# Patient Record
Sex: Male | Born: 1960 | Race: Black or African American | Hispanic: No | Smoking: Current every day smoker
Health system: Southern US, Community
[De-identification: ages and names within clinical notes are randomized; demographics above are authoritative.]

## PROBLEM LIST (undated history)

## (undated) DIAGNOSIS — N135 Crossing vessel and stricture of ureter without hydronephrosis: Secondary | ICD-10-CM

## (undated) DIAGNOSIS — IMO0001 Reserved for inherently not codable concepts without codable children: Secondary | ICD-10-CM

## (undated) DIAGNOSIS — K219 Gastro-esophageal reflux disease without esophagitis: Secondary | ICD-10-CM

## (undated) HISTORY — PX: ANKLE SURGERY: SHX546

---

## 2002-01-20 ENCOUNTER — Emergency Department (HOSPITAL_COMMUNITY): Admission: EM | Admit: 2002-01-20 | Discharge: 2002-01-20 | Payer: Self-pay

## 2003-09-13 ENCOUNTER — Emergency Department (HOSPITAL_COMMUNITY): Admission: EM | Admit: 2003-09-13 | Discharge: 2003-09-13 | Payer: Self-pay | Admitting: Family Medicine

## 2003-09-17 ENCOUNTER — Encounter: Admission: RE | Admit: 2003-09-17 | Discharge: 2003-09-17 | Payer: Self-pay | Admitting: Orthopedic Surgery

## 2003-09-19 ENCOUNTER — Emergency Department (HOSPITAL_COMMUNITY): Admission: EM | Admit: 2003-09-19 | Discharge: 2003-09-19 | Payer: Self-pay | Admitting: Internal Medicine

## 2003-09-30 ENCOUNTER — Ambulatory Visit (HOSPITAL_BASED_OUTPATIENT_CLINIC_OR_DEPARTMENT_OTHER): Admission: RE | Admit: 2003-09-30 | Discharge: 2003-09-30 | Payer: Self-pay | Admitting: Orthopedic Surgery

## 2003-11-30 ENCOUNTER — Encounter: Admission: RE | Admit: 2003-11-30 | Discharge: 2004-02-28 | Payer: Self-pay | Admitting: Orthopedic Surgery

## 2004-05-10 ENCOUNTER — Emergency Department (HOSPITAL_COMMUNITY): Admission: EM | Admit: 2004-05-10 | Discharge: 2004-05-10 | Payer: Self-pay | Admitting: Emergency Medicine

## 2005-11-22 ENCOUNTER — Emergency Department (HOSPITAL_COMMUNITY): Admission: EM | Admit: 2005-11-22 | Discharge: 2005-11-22 | Payer: Self-pay | Admitting: Emergency Medicine

## 2005-12-07 ENCOUNTER — Emergency Department (HOSPITAL_COMMUNITY): Admission: EM | Admit: 2005-12-07 | Discharge: 2005-12-07 | Payer: Self-pay | Admitting: Emergency Medicine

## 2006-04-11 ENCOUNTER — Ambulatory Visit (HOSPITAL_BASED_OUTPATIENT_CLINIC_OR_DEPARTMENT_OTHER): Admission: RE | Admit: 2006-04-11 | Discharge: 2006-04-12 | Payer: Self-pay | Admitting: Orthopedic Surgery

## 2006-04-12 ENCOUNTER — Ambulatory Visit (HOSPITAL_COMMUNITY): Admission: RE | Admit: 2006-04-12 | Discharge: 2006-04-12 | Payer: Self-pay | Admitting: Orthopedic Surgery

## 2006-09-06 ENCOUNTER — Emergency Department (HOSPITAL_COMMUNITY): Admission: EM | Admit: 2006-09-06 | Discharge: 2006-09-06 | Payer: Self-pay | Admitting: Emergency Medicine

## 2007-02-26 ENCOUNTER — Emergency Department (HOSPITAL_COMMUNITY): Admission: EM | Admit: 2007-02-26 | Discharge: 2007-02-26 | Payer: Self-pay | Admitting: Emergency Medicine

## 2007-02-28 HISTORY — PX: LEG AMPUTATION BELOW KNEE: SHX694

## 2007-05-20 ENCOUNTER — Inpatient Hospital Stay (HOSPITAL_COMMUNITY): Admission: EM | Admit: 2007-05-20 | Discharge: 2007-05-20 | Payer: Self-pay | Admitting: Emergency Medicine

## 2007-05-22 ENCOUNTER — Inpatient Hospital Stay (HOSPITAL_COMMUNITY): Admission: EM | Admit: 2007-05-22 | Discharge: 2007-05-27 | Payer: Self-pay | Admitting: Family Medicine

## 2007-05-23 ENCOUNTER — Ambulatory Visit: Payer: Self-pay | Admitting: Infectious Diseases

## 2007-05-31 ENCOUNTER — Inpatient Hospital Stay (HOSPITAL_COMMUNITY): Admission: RE | Admit: 2007-05-31 | Discharge: 2007-06-03 | Payer: Self-pay | Admitting: Orthopedic Surgery

## 2007-05-31 ENCOUNTER — Encounter (INDEPENDENT_AMBULATORY_CARE_PROVIDER_SITE_OTHER): Payer: Self-pay | Admitting: Orthopedic Surgery

## 2007-07-03 ENCOUNTER — Ambulatory Visit: Payer: Self-pay | Admitting: Internal Medicine

## 2007-07-03 DIAGNOSIS — S88119A Complete traumatic amputation at level between knee and ankle, unspecified lower leg, initial encounter: Secondary | ICD-10-CM | POA: Insufficient documentation

## 2007-07-03 DIAGNOSIS — B957 Other staphylococcus as the cause of diseases classified elsewhere: Secondary | ICD-10-CM | POA: Insufficient documentation

## 2007-07-03 DIAGNOSIS — M86679 Other chronic osteomyelitis, unspecified ankle and foot: Secondary | ICD-10-CM | POA: Insufficient documentation

## 2007-10-28 ENCOUNTER — Encounter: Admission: RE | Admit: 2007-10-28 | Discharge: 2007-12-17 | Payer: Self-pay | Admitting: Orthopedic Surgery

## 2007-11-14 ENCOUNTER — Emergency Department (HOSPITAL_COMMUNITY): Admission: EM | Admit: 2007-11-14 | Discharge: 2007-11-14 | Payer: Self-pay | Admitting: Family Medicine

## 2010-07-12 NOTE — Op Note (Signed)
Michael Morrison, DEMARTINI NO.:  [id number]   MEDICAL RECORD NO.:  [id number]          PATIENT TYPE:  INP   LOCATION:  5152                         FACILITY:  [facility]   PHYSICIAN:  Leonides Grills, M.D.     DATE OF BIRTH:  [birth year]   DATE OF PROCEDURE:  05/25/2007  DATE OF DISCHARGE:                               OPERATIVE REPORT   PREOPERATIVE DIAGNOSES:  1. Left medial ankle abscess.  2. Left posterior lateral ankle abscess.  3. Left ankle septic arthritis.   POSTOPERATIVE DIAGNOSES:  1. Left medial ankle abscess.  2. Left posterior lateral ankle abscess.  3. Left ankle septic arthritis.   OPERATION:  1. I and D to bone left medial ankle abscess.  2. I and D to bone left posterior lateral ankle abscess.  3. Left ankle arthroscopy with debridement.   ANESTHESIA:  General.   SURGEON:  Leonides Grills, M.D.   ASSISTANT:  Richardean Canal, P.A.   ESTIMATED BLOOD LOSS:  Approximately 200 mL.   COMPLICATIONS:  None.   CULTURES TAKEN:  Aerobic and anaerobic.   FINDINGS:  Gross purulence in all three areas.   DRAINS:  Two 1/8 inch drains, one placed medially and one placed  posterolaterally.   DISPOSITION:  Stable to PR.   INDICATION:  This is a 50 year old male who sustained a pilon fracture  well over 5 years ago.  This was complicated by post-traumatic ankle  arthritis.  He also developed a deep infection.  He required multiple I  and D's to include hardware removal and since the hardware removal has  not had any complications of drainage or an infection.  It was thought  that he had osteomyelitis of the distal tibia and likely of the talus.  He was doing well up until this past weekend when he was doing a job  where he was walking up and down ladders and he was not wearing his ASO  brace.  He was admitted to Dr. Albertina Parr service on Monday.  It was  thought that he had a flareup of his septic arthritis.  He had no sign  of infection at that time.  Over the  last week, however, he has had  increased swelling and pain and by MRI which showed that he had a pocket  of likely purulence and this was aspirated under C-arm guidance and he  was found to have purulence.  He was then consented to the above  procedure.  All risks which include infection, nerve/vessel injury,  persistent deep infection, likelihood that he has osteomyelitis and the  fact that this would best be treated with a below knee amputation and  possibility that this may spread systemically and have more proximal  amputation if the infection spread were all explained.  Questions were  encouraged and answered.   PROCEDURE IN DETAIL:  The patient was brought to the operating room and  placed in supine position after adequate general endotracheal anesthesia  was administered the patient was already on vancomycin IV piggyback.  The patient was then placed in the supine position and  the left lower  extremity was prepped and draped in a sterile manner.  All bony  prominences were well-padded.  We started the procedure by palpating a  fluctuant area medially about the left ankle.  A longitudinal incision  was then made.  There was gross purulence.  The incision was extended  approximately to about 4 cm.  This was on the anteromedial aspect of the  ankle.  Once this was completely debrided approximately 3 liters of  normal saline was passed through this area.  We then made a longitudinal  incision posterolaterally where there was a palpable area of fluctuance.  Again there was gross purulence.  Again another approximately 3 liters  of normal saline was irrigated through this area as well.  Once this was  fully irrigated the loculations were removed.  We then made a  longitudinal incision over the just lateral to the peroneus tertius  tendon into the ankle.  Again there was gross purulence in the ankle and  blunt tip trocar with cannula followed by camera was placed into the  ankle.  Then  we made a portal anteromedially with a nick and spread  technique and debrided the ankle of loculations with a shaver.  Once the  area was completely debrided again there was gross arthritis throughout  his ankle, no obvious loose bodies and we passed approximately 6 liters  of normal saline through his ankle as well.  Total amount of saline used  for this I and D was 12 liters.  At the end of the procedure there were  no other areas of fluctuation over the entire ankle or foot or leg.  There was no abnormal warmth anywhere as well.  We then placed one 1/8  inch drain in the medial wound and a 1/8 inch drain in the  posterolateral wound.  These were protected deep and then a loose  closure with nylon was then performed over all wounds.  We had a  communication of the ankle joint with both the medial and lateral wounds  and placed a drain over this area so that not only would it drain the  wound but also the ankle as well.  This was tunneled between the two  areas to the ankle.  A sterile dressing was applied, Jones dressing was  applied.  The patient was stable to the PR.  Also note that cultures  were obtained, aerobic and anaerobic, during the surgery as well.      Leonides Grills, M.D.  Electronically Signed     PB/MEDQ  D:  05/25/2007  T:  05/26/2007  Job:  [id number]

## 2010-07-12 NOTE — Consult Note (Signed)
Michael Morrison, Michael Morrison NO.:  [id number]   MEDICAL RECORD NO.:  [id number]          PATIENT TYPE:  INP   LOCATION:  1304                         FACILITY:  [facility]   PHYSICIAN:  Michael Morrison, M.D.     DATE OF BIRTH:  09/02/1960   DATE OF CONSULTATION:  05/20/2007  DATE OF DISCHARGE:                                 CONSULTATION   CHIEF COMPLAINT:  Left ankle pain.   HISTORY:  This is a 50 year old male who is well known to our service.  He had a previous severely comminuted left closed pilon fracture that  required open reduction/internal fixation.  This was complicated by a  deep infection that required I&D.  Eventually, once his fracture healed,  removed the hardware, and treated him with IV antibiotics.  Since this  time, he has not had any drainage.  He has been wearing an ASO brace and  taking anti-inflammatories.  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  He was not wearing his brace and flared  up his ankle.  He has had no fever, chills.  No drainage.  Just advanced  pain in the left ankle.   He has GERD.   He takes Lamisil.   He has no known drug allergies.   He smokes cigarettes, one pack per day.   REVIEW OF SYSTEMS:  He denies any fevers or chills.  No drainage.   PHYSICAL EXAMINATION:  Limited range of motion, left ankle.  No  erythema.  No fluctuans.  Tenderness on the anterior aspect.  He is  neurovascularly intact.   X-rays are evaluated.  Three views of the ankle obtained on 05/20/07.  Did show severely arthritic left ankle with cystic areas within the  distal tibia.   IMPRESSION:  Advanced post-traumatic left ankle arthritis.   PLAN:  I explained to Michael Morrison at this point, we will give him a dose  of Toradol today.  He is to go home on Mobic daily, 15 mg p.o. daily.  He is to follow up with [place] in two weeks.  He may benefit from a steroid  dose pack as  well and will likely do steroid dose pack followed  by Mobic.  Elevation  and ice are encouraged.  If he is to go about, he is to wear his ASO  brace.   I went over this with Dr. Concepcion Morrison, his admitting physician, and all  questions were encouraged and answered.      Michael Morrison, M.D.  Electronically Signed     PB/MEDQ  D:  05/20/2007  T:  05/20/2007  Job:  [id number]

## 2010-07-12 NOTE — Op Note (Signed)
NAMESTONY, STEGMANN NO.:  [id number]   MEDICAL RECORD NO.:  [id number]          PATIENT TYPE:  INP   LOCATION:  5152                         FACILITY:  [facility]   PHYSICIAN:  Leonides Grills, M.D.     DATE OF BIRTH:  [birth year]   DATE OF PROCEDURE:  05/25/2007  DATE OF DISCHARGE:  05/27/2007                               OPERATIVE REPORT   ADDENDUM:  In the actual procedure note, in the procedure in detail,  please add that both the medial ankle abscess as well as the posterior  lateral abscess, which were done through separate incisions, were  debridement down to bone in both instances.      Leonides Grills, M.D.  Electronically Signed     PB/MEDQ  D:  05/30/2007  T:  05/30/2007  Job:  [id number]

## 2010-07-12 NOTE — H&P (Addendum)
NAMENUH, LIPTON NO.:  [id number]   MEDICAL RECORD NO.:  [id number]          PATIENT TYPE:  EMS   LOCATION:  MAJO                         FACILITY:  [facility]   PHYSICIAN:  Marcellus Scott, MD     DATE OF BIRTH:  [birth year]   DATE OF ADMISSION:  05/22/2007  DATE OF DISCHARGE:                              HISTORY & PHYSICAL   PRIMARY MEDICAL DOCTOR:  Fleet Contras[place]  However, the patient claims he is going to be seeing a new  PMD shortly whose name is Alwyn Pea of [facility].  ORTHOPAEDICIANLeonides Grills, M.D.                            CHIEF COMPLAINT:  Painful swollen left ankle.   HISTORY OF PRESENTING ILLNESS:  Mr. Galant is a pleasant 50 year old  African-American male patient with a history of multiple surgeries on  the left ankle with baseline 2/10 pain.  He was in his usual state of  health until four to five days ago when after working on a ladder at his  home noticed painful swelling of his left ankle.  He then  presented to  [facility] where he was evaluated by orthopedics and  suggested to have posttraumatic left ankle arthritis.  The patient was  admitted, given a dose of vancomycin and subsequently discharged on  Vicodin and prednisone taper.  The patient however indicates that the  pain and swelling has progressively gotten worse which is 13/10!  The  patient, however, denies any direct trauma.  He denies any fever, chills  or rigors.  He took almost 12 tablets of Vicodin 7.5/750 between 3 p.m.  and 5 a.m. last night and has presented to the emergency room.  Blood  cultures drawn on the 23rd of March, one bottle shows gram-positive  cocci in clusters suggestive of Staph aureus.   PAST MEDICAL HISTORY:  None.   PAST SURGICAL HISTORY:  Left ankle fractures with three surgeries, the  last one on the 13th of February 2008 when hardware was removed.   PSYCHIATRIC HISTORY:   None.   ALLERGIES:  NO KNOWN DRUG ALLERGIES.   MEDICATIONS:  1. Vicodin 7.5/750 one p.o. q 4 hourly p.r.n.  2. Prednisone taper which is on day 3 which is 40 mg.   FAMILY HISTORY:  Strong family history of diabetes in uncle, brother,  mother, sister.   SOCIAL HISTORY:  The patient is married.  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  20 pack-year smoking history.  No  history of alcohol or drug abuse.  The patient is independent of  activities of daily living.   REVIEW OF SYSTEMS:  14 systems reviewed.  Apart from history of  presenting illness, his history is pertinent for occasional right-sided  abdominal pain which has been evaluated with CT of the abdomen  outpatient, report of which is pending.  The patient currently does not  have any abdominal  pain; also constipation.   PHYSICAL EXAMINATION:  Mr. Broyles is a moderately-built  and nourished  patient in no obvious distress.  VITAL SIGNS:  Temperature 100.6 degrees Fahrenheit, blood pressure  133/70 mmHg, pulse 86 per minute, regular, respirations 18 per minute,  saturating at 95% on room air.  HEAD, EYES, ENT:  Nontraumatic, normocephalic.  Pupils equally reacting  to light and accommodation.  Oral cavity with no oropharyngeal erythema  or thrush.  NECK:  Supple.  No JVD or carotid bruit.  LYMPHATICS:  No lymphadenopathy.  RESPIRATORY SYSTEM:  Clear to auscultation.  CARDIOVASCULAR SYSTEM:  First and second heart sounds heard.  No third  or fourth heart sounds or murmurs.  ABDOMEN:  Nondistended, nontender.  No organomegaly or mass appreciated.  Bowel sounds are normally heard.  CENTRAL NERVOUS SYSTEM:  The patient is awake, alert and oriented x3.  No focal neurological deficits.  EXTREMITIES:  Left ankle with surgical scars.  There are no open wounds.  Left ankle is swollen moderately with soft tissue swelling.  Warm and  tender with painful range of movements.  Peripheral pulses are   symmetrically felt.  SKIN:  Without any rashes.   LABORATORY DATA:  Comprehensive metabolic panel unremarkable except for  albumin of 3.2.  BUN is 7, creatinine is 0.79, blood acetaminophen level  less than 10.  CBC:  Hemoglobin 13.2, hematocrit 39.6, white blood cell  21.3, platelets 252.  Blood culture as indicated on the 23rd of March  was positive for gram-positive cocci in clusters, and the next sample  was no growth to date.  Serum uric acid on the 23rd of March was 4.1.  ESR on the 23rd of March was 85.   His ankle x-rays done today with no interval change.   ASSESSMENT/PLAN:  1. Painful swollen left ankle/rule out acute osteomyelitis versus      septic arthritis versus post traumatic left ankle arthritis.  Will      admit patient to medical floor.  Will repeat blood cultures.  Will      obtain MRI of the left ankle.  Will place the patient empirically      on IV vancomycin.  Will provide p.r.n. pain medications including      IV Dilaudid and p.o. oxycodone.  Will avoid Tylenol  at this time.      Orthopedics has been consulted.  I have discussed his case with Dr.      Darlina Sicilian of infectious disease who is agreeable to continue IV      vancomycin alone at this time.  If there is significant fluid, it      is  in the left ankle joint to be aspirated. The patient will be      seen tomorrow by infectious disease.  2. Leukocytosis.  Probably secondary to problem #1 and steroids.  Will      hold steroids at this time.  3. Tobacco abuse.  For counseling and nicotine patch.  4. Staphylococcal bacteremia.  For IV vancomycin pending cultures.      Marcellus Scott, MD  Electronically Signed     AH/MEDQ  D:  05/22/2007  T:  05/22/2007  Job:  [id number]   cc:   Fleet Contras, M.D.  Leonides Grills, M.D.

## 2010-07-12 NOTE — Discharge Summary (Signed)
Michael Morrison, Michael Morrison NO.:  [id number]   MEDICAL RECORD NO.:  [id number]          PATIENT TYPE:  INP   LOCATION:  5152                         FACILITY:  [facility]   PHYSICIAN:  Leonides Grills, M.D.     DATE OF BIRTH:  11-Mar-1960   DATE OF ADMISSION:  05/22/2007  DATE OF DISCHARGE:  05/27/2007                               DISCHARGE SUMMARY   ADMITTING DIAGNOSES:  1. Left ankle pain.  2. Leukocytosis.   DISCHARGE DIAGNOSES:  1. Left ankle abscess with osteomyelitis status post arthroscopic      incision and drainage.  2. Methicillin-resistant Staphylococcus aureus bacteremia with      decreased leukocytosis.  3. Acute blood loss anemia secondary to surgery, asymptomatic.   HISTORY OF PRESENT ILLNESS:  Michael Morrison is a 50 year old African  American male with history of right ankle pilon fracture with  intermittent infections.  He eventually had the hardware removed from  the ankle by Dr. Lestine Box in February 2008.  Overall, the patient has  done well  and since that time and actually returned to work.  On the  May 20, 2007, the patient was admitted due to an acute flare of left  ankle OA.  At that time, his white count was 11,400 and sed rate was 52.  Blood cultures were drawn at that time.  The patient was discharged home  on a prednisone taper and a dose of vancomycin given while hospitalized.  The patient was placed on the antiinflammatories.  The patient then  presented to the ER on May 22, 2007, with increased swelling and pain  in the left ankle, and also noted decreased range of motion of the  ankle.  Blood cultures from the May 20, 2007, on admission in the  interim came back positive for MRSA.  The patient was admitted due to  the pain in the ankle and swelling.   SURGICAL PROCEDURE:  The patient was taken to the operating room on  May 25, 2007, by Dr. Leonides Grills, assisted by Richardean Canal, PA-C.  The patient was placed on general anesthesia and  then underwent I&D to  bone left medial ankle abscess and I&D to bone left posterior ankle  abscess, next left ankle arthroscopy with debridement.  The patient  tolerated the procedure well and returned to the recovery room in good  stable condition.  Cultures were obtained during the procedure.   CONSULTATIONS:  Following consults were obtained while the patient was  hospitalized:  1. [facility].  2. Infectious Disease.  3. Fleet Contras, MD  4. Physical Therapy.  5. Case management.   HOSPITAL COURSE:  On May 22, 2007, the patient was admitted to the  hospital secondary to left ankle pain.  The patient was started on  vancomycin and a stat MRSA of the left ankle was obtained.   On May 23, 2007, MRI of the left ankle showed extensive posttraumatic  and postsurgical changes with fluid collection tracking out of  tibiotalar joint posterior superior questionable abscess.  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  Radiographs show no change from prior films on May 19, 2007, which  showed posttraumatic operative change of the distal tib/fib.  The  patient complained of left ankle pain and increased range of motion.  The patient with a T-max of 100.9.  White count was trending down from  admission.  White count at 21,300 was down to 20,300.  Sed rate was 87.  This was elevated from the May 20, 2007 sed rate, which was 50.  The  patient was sent later that day for aspiration of the questionable  abscess of the left ankle under fluoroscopy.   On May 24, 2007, the patient with continued left ankle pain.  T-max  was 102.4.  Vital signs at that point stable.  White count was 20,000.  Synovial fluid of the left ankle showed turbid aspirate of 140,000 of  white blood cells intracellular/extracellular bacteria.  The patient was  placed n.p.o.  I and D of the left ankle  on May 25, 2007. The patient  remained on vancomycin per protocol.   On May 26, 2007, the patient overall was feeling much better, wanting  to go home, afebrile.  Vital signs were stable.  White count continued  to trend down to 17,100.  Hemoglobin was 8.1, 23.7.  The patient without  any symptoms of anemia.  Routine chemistries on May 26, 2007, was as  follows:  Sodium 138, potassium 3.5, chloride 104, bicarbonate 26, BUN  of 4, creatinine of 0.92, glucose of 160, baseline.   On May 27, 2007, the patient overall states his left ankle pain is  much improved.  He is having very little if any pain.  He is comfortable  with no chest pain or shortness of breath.  T-max was 100.7.  Vital  signs otherwise stable.   Blood cultures:  Blood cultures on May 20, 2007, and May 22, 2007,  all showed MRSA.  On May 20, 2007, was negative.  Blood culture from  the May 24, 2007, was no growth day x2.  Wound culture:  Wound culture  from May 20, 2007, and May 25, 2007, was negative for anaerobes and  positive for Staphylococcus aureus.   The patient was desiring to go home.  The patient continued on  vancomycin on day 6.   The patient had a discussion Dr. Lestine Box, and it was felt that the  patient to be discharged to home and follow up with [place] in the office on  Thursday.  The patient is told that if he develops fevers, chills, or  any signs of infection that he needs to go to the emergency room.   LABORATORY DATA:  Routine labs on admission, white count was 21,300,  hemoglobin 13.2, hematocrit was 39.6, and platelets were 252,000.   Chemistries:  Routine chemistries on admission, sodium 135, potassium  3.7, chloride 96, bicarbonate 27, BUN 7, creatinine 0.79, and platelets  127.   Blood cultures:  Blood cultures from May 20, 2007, and May 22, 2007,  x2 were all positive for MRSA.  On May 20, 2007, one was negative.  Blood culture on May 24, 2007, negative to date x2,  and urine culture  from May 25, 2007, showed no anaerobes to date.  It is positive for  Staphylococcus aureus.   RADIOGRAPHS:  MRI without contrast on May 22, 2007, showed extensive  posttraumatic and postsurgical change in left ankle with fluid  collection tracking out  of the tibia talar joint  posterior and  superior,  questionable abscess.  Aspirations recommended.  Edema  enhancement posterior lateral talus worrisome for osteomyelitis.  Findings could be due to degenerative posttraumatic changesbut lack of  abnormal _________ at the calcaneus  as at the subtalar joint makes it  less likely.  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   On May 22, 2007, ankle radiographs, no change from May 20, 2007,  films, again significant posttraumatic and surgical changes involving  the left ankle.   On May 24, 2007, fluoro-guided left ankle aspiration purulent  drainage, red colored, sent for culture.   Chest x-ray one view on May 24, 2007, the patient is status post PICC  line placement.  PICC tip in the distal SVC level.   MEDICATIONS:  Routine meds on the floor:  1. Vancomycin IV protocol.  2. NicoDerm CQ 21 mg 24 hour patch daily.  3. Senokot-S tab 1 p.o. nightly.  4. Protonix 40 mg 1 p.o. daily.  5. Oxycodone 5 mg 1 p.o. q.4 h. p.r.n. pain.  6. Tylenol 325 mg 1-2 tabs p.o. q.4 h. p.r.n.  7. Albuterol 2.5 mg inhaler q.6 h. p.r.n.  8. Calcium 1 tab p.o. daily.   DISCHARGE INSTRUCTIONS:  1. Diet without restrictions.  2. Wound care.  The patient is to keep left lower leg Jones dressing      clean, dry, and intact.  3. Activity.  The patient is non-weightbearing left lower leg with      crutches or walker  as tolerated.   FOLLOWUP:  The patient is to follow up with Dr. Lestine Box in the office on  next Thursday.  The patient is to call us at [phone] for appointment.   MEDICATIONS:  The patient was discharged on:  1. NicoDerm CQ 21 mg  24-hour patch.  2. Vancomycin IV per pharmacy protocol.  3. Oxycodone 5 mg tab 1 p.o. q.4 h. p.r.n. pain.  4. Protonix 40 mg 1 p.o. daily.  5. Tylenol 325, 1-2 tablets p.o. q.4 h. p.r.n. temp greater than 101.0      degrees Fahrenheit.   CONDITION ON DISCHARGE:  The patient was discharged home in good, stable  condition.   SPECIAL INSTRUCTIONS:  Case management was to set up home IV antibiotics  plus equipment needs.  Once these are in place, the patient will be  discharged home with the understanding that if he has any fevers,  chills, or signs of infection that he is to call Dr. Ashok Norris office  immediately or go to the emergency. This was discussed at length with  the patient by myself and by Dr. Lestine Box.      Richardean Canal, P.A.      Leonides Grills, M.D.  Electronically Signed    GC/MEDQ  D:  05/27/2007  T:  05/28/2007  Job:  [id number]

## 2010-07-12 NOTE — Op Note (Signed)
NAMENOLE, ROBEY NO.:  [id number]   MEDICAL RECORD NO.:  [id number]          PATIENT TYPE:  INP   LOCATION:  5034                         FACILITY:  [facility]   PHYSICIAN:  Leonides Grills, M.D.     DATE OF BIRTH:  [birth year]   DATE OF PROCEDURE:  05/31/2007  DATE OF DISCHARGE:                               OPERATIVE REPORT   PREOPERATIVE DIAGNOSIS:  Left distal tibial osteomyelitis.   POSTOPERATIVE DIAGNOSIS:  Left distal tibial osteomyelitis.   OPERATION:  Left below-knee amputation.  Rigid cast applied by Judy Pimple  from [place].   ANESTHESIA:  General.   SURGEON:  Leonides Grills, M.D.   ASSISTANT:  Rexene Edison, P.A.-C.   ESTIMATED BLOOD LOSS:  Minimal.   TOURNIQUET TIME:  Approximately half hour.   COMPLICATIONS:  None.   DISPOSITION:  Stable to PR.   INDICATIONS:  A 50 year old male who had a pilon fracture that was  complicated by a deep infection that went onto osteomyelitis.  He  recently had an advanced infection with MRSA and underwent I&D last week  and has subsequently progressed.  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  We chose to proceed  with the above reconstructive procedure.  All risks which include  infection, nerve vessel injury - neuroma formation specifically, more  proximal amputation, wound breakdown, stump pain, and contracture were  all explained.  Questions were encouraged and answered.   OPERATIVE PROCEDURE:  The patient was brought to the operating room and  placed in supine position.  After adequate general endotracheal tube  anesthesia was administered as well as Ancef 1 g IV piggyback, left  lower extremity was then prepped and draped in a sterile manner over a  proximally thigh tourniquet.  Prior to limb being gravity  exsanguinated,  we mapped out the entire flap that was to be performed.  We did a flap  as described by Mercy Riding with a long posterior flap.  We used the pivot  point of the flap two-thirds of the diameter on either side of the leg  and then tapered.  The bone cut would be 16 cm distal to the knee joint.  We then gravity exsanguinated the left lower extremity, and the  tourniquet was elevated to 290 mmHg.  We started the procedure by  incising the skin.  We identified this superficial peroneal nerve and  saphenous vein.  The vein was ligated, nerve was cut, and retracted  deep.  We also identified the saphenous nerve which was also cut and  retracted back into the wound as well.  We then incised into the  anterolateral aspects of the leg as well.  We then elevated the  periosteum approximately a centimeter proximally.  We then incised  through the anterior and lateral compartments.  We then identified the  fibula as well.  Sagittal saw osteotomized the  tibia and then  osteotomized the fibula approximately a centimeter proximal to the  tibia.  We then beveled anterior surface of the tibia as well as the  edges of the tibia as well.  We then incised the remaining part of the  skin incision posterior flap and, then with an amputation knife staying  close to the posterior surface of the tibia and fibula respectively,  amputated the remaining part of the leg and exiting out the posterior  flap.  We then identified anterior tibial, posterior tibial arteries and  veins and ligated respectively as well as the peroneal artery.  Once  these were ligated, tibial nerve was identified, dissected out  proximally, and then cut as proximal as possible above the stump.  We  also applied the deep peroneal nerve as well.  The area was copiously  irrigated with normal saline.  Tourniquet was inflated.  Hemostasis was  obtained.  There was no pulsatile bleeding.  One-eighth inch drain was  placed  anterolaterally.  We then trimmed the posterior flap to  accommodate the flap.  We then did the myodesis with 0 Vicryl stitch.  We then closed the subcutaneous with 2-0 Vicryl stitch, and the skin was  closed with staples.  Sterile dressing was applied.  Rigid cast was  applied by Judy Pimple from [place].  The patient was stable to the PR.      Leonides Grills, M.D.  Electronically Signed     PB/MEDQ  D:  05/31/2007  T:  05/31/2007  Job:  [id number]

## 2010-07-12 NOTE — Consult Note (Signed)
NAMEMELVERN, Michael NO.:  [id number]   MEDICAL RECORD NO.:  [id number]          PATIENT TYPE:  INP   LOCATION:  5152                         FACILITY:  [facility]   PHYSICIAN:  Leonides Grills, M.D.     DATE OF BIRTH:  Jan 30, 1961   DATE OF CONSULTATION:  05/23/2007  DATE OF DISCHARGE:                                 CONSULTATION   CHIEF COMPLAINT:  Left ankle pain and swelling.   HISTORY OF PRESENT ILLNESS:  The patient is a 50 year old African  American male with a history of right ankle pilon fracture with  subsequent removal of hardware.  Prior to removal of hardware, the  patient developed a deep tissue infection and underwent I&D and IV  antibiotics.  The patient with recent admission on March 23, secondary  to acute flare of left ankle OA.  At that time white blood cell count  was 11,400 and sed rate 52.  Blood cultures at that time were drawn.  The patient was discharged home on prednisone taper and a dose of  Vancomycin was given also.  The patient was placed on anti-inflammatory.  The patient presented to the ER on May 22, 2007, with increased  swelling and pain in the left ankle.  Now with decreased range of motion  of the ankle and pain.  He states that he was doing well prior to the  May 20, 2007, admission and had done well for approximately three  years following surgery.  Blood cultures which were drawn during the  May 20, 2007, admission, one of the two blood cultures came back  positive for MRSA on May 23, 2007.   PAST MEDICAL HISTORY:  No medical conditions.   PHYSICAL EXAMINATION:  VITAL SIGNS:  T.current 98.6, TMAX 109, pulse 90,  respiratory rate 20, blood pressure 147/76, and O2 saturations 99% on  room air.  EXTREMITIES:  Left ankle is warm, tender.  Virtually no dorsal or  plantar flexion secondary to pain.  He has significant edema about the  ankle joint.  Foot is neurovascular intact.   MRI dated May 22, 2007, of the left ankle  showed extensive  posttraumatic and post surgical changes with fluid collecting tracking  out of the tibiotalar joint posteriorly and superiorly, question  abscess.  Edema and enhancement posterior lateral talus, questionable  osteomyelitis.  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   Left ankle radiographs; no change from May 19, 2007.  Radiographs of  the left ankle which showed posttraumatic and operative changes of the  distal tibia and fibula.   ASSESSMENT:  The patient is a 50 year old male with left ankle pain and  swelling, severe arthritic change secondary to traumatic and surgical  changes.  Now with one blood culture with MRSA.  MRI showed a left ankle  fluid collection at the tibial talar joint, questionable abscess.   1. CT aspiration of the left ankle with cultures (the patient on      Vancomycin).  2. He will probably need left ankle arthroscopic lavage and will  probably need longterm antibiotic therapy.  3. Osteonecrosis/osteomyelitis of left ankle that may require left      below knee amputation in the future if the      patient fails therapy and conservative treatment.  4. We will follow with the primary team.   Thanks for consult.      Michael Morrison, P.A.      Leonides Grills, M.D.  Electronically Signed    GC/MEDQ  D:  05/23/2007  T:  05/24/2007  Job:  [id number]

## 2010-07-15 NOTE — Discharge Summary (Signed)
NAMEELISA, Michael Morrison NO.:  [id number]   MEDICAL RECORD NO.:  [id number]          PATIENT TYPE:  INP   LOCATION:  5034                         FACILITY:  [facility]   PHYSICIAN:  Leonides Grills, M.D.     DATE OF BIRTH:  [birth year]   DATE OF ADMISSION:  05/31/2007  DATE OF DISCHARGE:  06/03/2007                               DISCHARGE SUMMARY   ADMISSION DIAGNOSES:  1. Osteomyelitis of the left tibia.  2. History of methicillin-resistant staphylococcus.  3. History of anemia of chronic illness.   DISCHARGE DIAGNOSES:  1. Osteomyelitis of the left tibia.  2. History of methicillin-resistant staphylococcus.  3. History of anemia of chronic illness.  4. Status post left below-the-knee amputation.   HISTORY:  Michael Morrison is a 50 year old gentleman who is well-known to Dr.  Ashok Norris practice.  He has a previous history of MRSA.  In March 2009,  he underwent debridement of a left medial ankle abscess as well as  laterally.  He was also placed on vancomycin at that time.  He did well  for quite sometime but unfortunately developed osteomyelitis of the left  lower tibia.  We felt at that time he would benefit from a below-the-  knee amputation.  The risks and benefits of the surgery were discussed  with the patient by Dr. Lestine Box, and he does wish to proceed.   CONSULTATIONS:  PT and OT.   CARE MANAGEMENT:  Fleet Contras, MD   PROCEDURES:  The patient was taken to the OR on May 31, 2007, and  underwent a left below-the-knee amputation.  Surgeons was Dr. Leonides Grills.  Assistant was [employer], PA-C.  Anesthesia was general.  Complications were none.   PREOPERATIVE DATA:  Admission labs showed a white cell count of 11.3,  hemoglobin 8.7, hematocrit 25.7, with a platelet count of 548.  This was  followed throughout the hospital course.  White cell count normalized at  the time of discharge at 8.2, hemoglobin did drop to a level of 7.7,  hematocrit 23.3 with a  platelet count of 530; this was on postoperative  day #2.  The patient was transfused 1 unit of packed red blood cells.  At the time of discharge, hemoglobin stabilized at 8.7 with a hematocrit  of 25.9 and platelet count of 527.  Routine chemistries done  preoperatively were within normal range.  These were followed throughout  the hospital course and remained stable.  Blood type is O positive.  I  do not see a preop EKG or chest x-ray in the chart.   HOSPITAL COURSE:  The patient was admitted, taken to the OR, and  underwent the above-stated procedure.  He was then transferred to the  PACU and then to the orthopedic floor for continued postoperative care.  The patient was continued postoperatively on his vancomycin.  He already  had a PICC line placement.  He was doing fairly well.  On postop day #1,  drain was removed without difficulty.  Hemoglobin was stable.  On postop  day #2, hemoglobin had decreased to 7.7.  He, at that point, was  transfused 1 unit of packed red blood cells.  Primary care physician was  consulted.  The patient does have a known history of chronic anemia.  The patient denied any pain.  Denied any chest pain or shortness of  breath.  On postoperative day #3, the patient remained afebrile.  Vital  signs were stable.  Hemoglobin had improved.  So at this point, the  patient was stable to be discharged home.  He was seen by primary care  physician, Dr. Fleet Contras.  He felt he was medically stable as well.   DISPOSITION:  The patient is stable to be discharged home with advanced  home care.   FOLLOWUP:  The patient is to follow up with Dr. Lestine Box in approximately  2 weeks.  He is to follow up with his primary care physician in  approximately 2-4 weeks for a hemoglobin check.   DISCHARGE INSTRUCTIONS:  To keep casted area clean, dry, and intact.  He  is to utilize crutches or walker.  He is also to make an appointment  with Kathlene November at [place] the following week  for cast care.   DISCHARGE MEDICATIONS:  Include,  1. Robaxin 500 mg 1 p.o. q.8 h. p.r.n. spasm.  2. Vancomycin as per protocol.  3. A.S.A. EC p.r.n.   Resume all home medications.   DIET:  As tolerated.   CONDITION ON DISCHARGE:  Stable.   FINAL DIAGNOSIS:  Doing well status post left below-the-knee amputation.      Roma Schanz, P.A.      Leonides Grills, M.D.  Electronically Signed    CS/MEDQ  D:  07/31/2007  T:  08/01/2007  Job:  [id number]

## 2010-07-15 NOTE — Op Note (Signed)
NAME:  Michael Morrison, Michael Morrison                           ACCOUNT NO.:  [id number]   MEDICAL RECORD NO.:  [id number]                   PATIENT TYPE:  AMB   LOCATION:  DSC                                  FACILITY:  [facility]   PHYSICIAN:  Leonides Grills, M.D.                  DATE OF BIRTH:  October 22, 1960   DATE OF PROCEDURE:  09/30/2003  DATE OF DISCHARGE:                                 OPERATIVE REPORT   PREOPERATIVE DIAGNOSES:  1. Left closed pillion fracture.  2. Left lateral malleolus fracture.  3. Complication of hardware, left ankle.   POSTOPERATIVE DIAGNOSES:  1. Left closed pillion fracture.  2. Left lateral malleolus fracture.   OPERATION:  1. Open reduction, internal fixation of left pillion fracture.  2. Stress x-rays, left ankle.  3. Open reduction, internal fixation of left lateral malleolus fracture.  4. Left sural nerve neurolysis.  5. Hardware removal, superficial, left ankle.   ANESTHESIA:  General endotracheal tube with popliteal block.   SURGEON:  Leonides Grills, M.D.   ASSISTANT:  Lianne Cure, P.A.- C.   ESTIMATED BLOOD LOSS:  Minimum.   TOURNIQUET TIME:  Two hours.   COMPLICATIONS:  None.   DISPOSITION:  Stable to the PAR.   INDICATIONS FOR PROCEDURE:  This 50 year old male who sustained the above  injury.  Until his soft tissues improved, which was approximately just over  two weeks, he was consented for the above procedure.  All risks which  include infection, nerve or vessel injury, nonunion, malunion, hardware  rotation, hardware failure, persistent pain, worsening pain, stiffness,  arthritis and possible future fusion, skin breakdown and possible  amputation, were all explained.  Questions were encouraged and answered.   DESCRIPTION OF PROCEDURE:  The patient was brought to the operating room and  placed in the supine position. After adequate general endotracheal tube  anesthesia was administered, as well as Ancef 1 g IV piggyback, and also  given a  popliteal block as well preoperatively.  The patient was then placed  in a sloppy lateral position with the operative side up.  All bony  prominences were well-padded.  We then prepped and draped the left lower  extremity in a sterile manner where a proximally-placed thigh tourniquet was  gravity-exsanguinated on the left lower extremity, and the tourniquet was  elevated to 290 mmHg.  We started the procedure with a curved linear  posterolateral approach at the ankle.  Dissection was carried down through  the skin.  A formal sural nerve neurolysis was then performed.  We then  approached anteriorly towards the lateral malleolus/fibular area.  This was  highly comminuted. We then applied a 10-hole one-third tubular locking  plate, after it was pre-bent to the natural contours to the lateral  malleolus proximal fibula.  We then placed two distal locking screws in the  plate, and then lengthened through the comminution for a bridge-type  construct.  We then applied two proximal screws within the fibula, again  were locking and then applied fixation in between 3.5 mm fully-threaded  cortical screws, as well as locking screws.  This had excellent maintenance  of the reduction.  We did not devascularize any of the fragments, and we  tried to bridge the highly comminuted areas.  The intercalated piece was  affixed with a screw as well to the plate.  X-rays were obtained AP and  lateral and mortis views, and showed that it was in an anatomic position.  We then approached through the same incision, but approached just posterior  to the peroneal tendon, retracting the sural nerve out of harm's way.  We  approached the posterior tibia, retracted the flexor hallucis longus  medially, and entered the joint.  The piece was rotated 90 degrees and 45  degrees respectively posteriorly.  There was an intercalated fracture  fragment that was removed and was later placed back into the ankle.  We then  booked  open the fracture fragment and reduced the anterior aspect of the  ankle anatomically through this wound. We then removed any intercalary  fracture fragments that were within the joint, as well as hematoma, and  anatomically reduced the joint.  The joint anteriorly was anatomic.  We then  applied the posterior malleolar fragment, and this had an excellent fit, and  was provisionally fixed with a K-wire.  We then applied a T-small frag  locking plate; however, we did not use any locking screws.  We used only 3.5  mm fully-threaded cortical screws, after the intercalated piece was applied  to the posterior aspect of the tibia.  This had an excellent fit in  reduction.  This was verified under C-arm guidance in the AP and lateral  planes to be in an anatomic position.  The area was copiously irrigated with  normal saline.  X-Fix at this point was removed.  Stress x-rays were  obtained and showed in the AP and lateral planes that syndesmosis was  surprisingly intact as well.  Once the wound was copiously irrigated with  normal saline and X-Fix was removed, the tourniquet was deflated.  Hemostasis was obtained.  The subcutaneous was closed with #3-0 Vicryl.  The  skin was closed with #4-0 nylon.  A sterile dressing was applied.  A  modified Jones dressing was applied with the ankle in loose dorsiflexion.  Also of note, at the end of the procedure the ankle was ranged, and there  was no crepitus and had excellent range of motion. There was no subluxation  as well with stress films.                                               Leonides Grills, M.D.    PB/MEDQ  D:  09/30/2003  T:  09/30/2003  Job:  [id number]

## 2010-10-25 ENCOUNTER — Emergency Department (HOSPITAL_COMMUNITY)
Admission: EM | Admit: 2010-10-25 | Discharge: 2010-10-25 | Disposition: A | Discharge status: Home / Self Care | Payer: Medicare Other | Attending: Emergency Medicine | Admitting: Emergency Medicine

## 2010-10-25 ENCOUNTER — Encounter (HOSPITAL_COMMUNITY): Payer: Self-pay

## 2010-10-25 ENCOUNTER — Emergency Department (HOSPITAL_COMMUNITY): Payer: Medicare Other

## 2010-10-25 DIAGNOSIS — F17200 Nicotine dependence, unspecified, uncomplicated: Secondary | ICD-10-CM | POA: Insufficient documentation

## 2010-10-25 DIAGNOSIS — N1330 Unspecified hydronephrosis: Secondary | ICD-10-CM | POA: Insufficient documentation

## 2010-10-25 DIAGNOSIS — R109 Unspecified abdominal pain: Secondary | ICD-10-CM | POA: Insufficient documentation

## 2010-10-25 DIAGNOSIS — R112 Nausea with vomiting, unspecified: Secondary | ICD-10-CM | POA: Insufficient documentation

## 2010-10-25 HISTORY — DX: Reserved for inherently not codable concepts without codable children: IMO0001

## 2010-10-25 LAB — DIFFERENTIAL
Basophils Absolute: 0 10*3/uL (ref 0.0–0.1)
Basophils Relative: 0 % (ref 0–1)
Monocytes Relative: 4 % (ref 3–12)
Neutro Abs: 8.2 10*3/uL — ABNORMAL HIGH (ref 1.7–7.7)
Neutrophils Relative %: 83 % — ABNORMAL HIGH (ref 43–77)

## 2010-10-25 LAB — COMPREHENSIVE METABOLIC PANEL
ALT: 18 U/L (ref 0–53)
AST: 18 U/L (ref 0–37)
Calcium: 9.4 mg/dL (ref 8.4–10.5)
Creatinine, Ser: 0.88 mg/dL (ref 0.50–1.35)
GFR calc Af Amer: >60 mL/min (ref >60)
Sodium: 141 mEq/L (ref 135–145)
Total Protein: 7.3 g/dL (ref 6.0–8.3)

## 2010-10-25 LAB — CBC
Hemoglobin: 14.1 g/dL (ref 13.0–17.0)
RBC: 4.7 MIL/uL (ref 4.22–5.81)
WBC: 9.9 10*3/uL (ref 4.0–10.5)

## 2010-10-25 LAB — URINE MICROSCOPIC-ADD ON

## 2010-10-25 LAB — URINALYSIS, ROUTINE W REFLEX MICROSCOPIC
Glucose, UA: NEGATIVE mg/dL
Leukocytes, UA: NEGATIVE
Specific Gravity, Urine: 1.019 (ref 1.005–1.030)
pH: 8.5 — ABNORMAL HIGH (ref 5.0–8.0)

## 2010-11-08 ENCOUNTER — Other Ambulatory Visit (HOSPITAL_COMMUNITY): Payer: Self-pay | Admitting: Urology

## 2010-11-08 DIAGNOSIS — N135 Crossing vessel and stricture of ureter without hydronephrosis: Secondary | ICD-10-CM

## 2010-11-11 ENCOUNTER — Encounter (HOSPITAL_COMMUNITY)
Admission: RE | Admit: 2010-11-11 | Discharge: 2010-11-11 | Disposition: A | Discharge status: Home / Self Care | Payer: Medicare Other | Source: Ambulatory Visit | Attending: Urology | Admitting: Urology

## 2010-11-11 DIAGNOSIS — N1330 Unspecified hydronephrosis: Secondary | ICD-10-CM | POA: Insufficient documentation

## 2010-11-11 DIAGNOSIS — N135 Crossing vessel and stricture of ureter without hydronephrosis: Secondary | ICD-10-CM

## 2010-11-11 MED ORDER — FUROSEMIDE 10 MG/ML IJ SOLN: 45.0000 mg | Freq: Once | INTRAMUSCULAR | Status: DC

## 2010-11-11 MED ORDER — TECHNETIUM TC 99M MERTIATIDE
15.3000 | Freq: Once | INTRAVENOUS | Status: AC | PRN
  Administered 2010-11-11: 15 via INTRAVENOUS

## 2010-11-18 ENCOUNTER — Ambulatory Visit (HOSPITAL_COMMUNITY)
Admission: RE | Admit: 2010-11-18 | Discharge: 2010-11-18 | Disposition: A | Discharge status: Home / Self Care | Payer: Medicare Other | Source: Ambulatory Visit | Attending: Urology | Admitting: Urology

## 2010-11-18 ENCOUNTER — Encounter (HOSPITAL_COMMUNITY): Payer: Medicare Other

## 2010-11-18 ENCOUNTER — Other Ambulatory Visit: Payer: Self-pay | Admitting: Urology

## 2010-11-18 ENCOUNTER — Other Ambulatory Visit (HOSPITAL_COMMUNITY): Payer: Self-pay | Admitting: Urology

## 2010-11-18 DIAGNOSIS — J984 Other disorders of lung: Secondary | ICD-10-CM | POA: Insufficient documentation

## 2010-11-18 DIAGNOSIS — Z01811 Encounter for preprocedural respiratory examination: Secondary | ICD-10-CM

## 2010-11-18 DIAGNOSIS — Z01812 Encounter for preprocedural laboratory examination: Secondary | ICD-10-CM | POA: Insufficient documentation

## 2010-11-18 DIAGNOSIS — N329 Bladder disorder, unspecified: Secondary | ICD-10-CM | POA: Insufficient documentation

## 2010-11-18 DIAGNOSIS — Z01818 Encounter for other preprocedural examination: Secondary | ICD-10-CM | POA: Insufficient documentation

## 2010-11-18 DIAGNOSIS — Z01810 Encounter for preprocedural cardiovascular examination: Secondary | ICD-10-CM | POA: Insufficient documentation

## 2010-11-18 LAB — BASIC METABOLIC PANEL
BUN: 15 mg/dL (ref 6–23)
Chloride: 103 mEq/L (ref 96–112)
GFR calc Af Amer: >60 mL/min (ref >60)
Potassium: 4.4 mEq/L (ref 3.5–5.1)
Sodium: 136 mEq/L (ref 135–145)

## 2010-11-18 LAB — SURGICAL PCR SCREEN: MRSA, PCR: NEGATIVE

## 2010-11-21 ENCOUNTER — Other Ambulatory Visit: Payer: Self-pay | Admitting: Urology

## 2010-11-21 ENCOUNTER — Ambulatory Visit (HOSPITAL_COMMUNITY)
Admission: RE | Admit: 2010-11-21 | Discharge: 2010-11-21 | Disposition: A | Discharge status: Home / Self Care | Payer: Medicare Other | Source: Ambulatory Visit | Attending: Urology | Admitting: Urology

## 2010-11-21 DIAGNOSIS — K219 Gastro-esophageal reflux disease without esophagitis: Secondary | ICD-10-CM | POA: Insufficient documentation

## 2010-11-21 DIAGNOSIS — F17200 Nicotine dependence, unspecified, uncomplicated: Secondary | ICD-10-CM | POA: Insufficient documentation

## 2010-11-21 DIAGNOSIS — N135 Crossing vessel and stricture of ureter without hydronephrosis: Secondary | ICD-10-CM | POA: Insufficient documentation

## 2010-11-21 DIAGNOSIS — C670 Malignant neoplasm of trigone of bladder: Secondary | ICD-10-CM | POA: Insufficient documentation

## 2010-11-21 DIAGNOSIS — R3129 Other microscopic hematuria: Secondary | ICD-10-CM | POA: Insufficient documentation

## 2010-11-21 DIAGNOSIS — Z01818 Encounter for other preprocedural examination: Secondary | ICD-10-CM | POA: Insufficient documentation

## 2010-11-21 DIAGNOSIS — S88119A Complete traumatic amputation at level between knee and ankle, unspecified lower leg, initial encounter: Secondary | ICD-10-CM | POA: Insufficient documentation

## 2010-11-21 DIAGNOSIS — Z01812 Encounter for preprocedural laboratory examination: Secondary | ICD-10-CM | POA: Insufficient documentation

## 2010-11-21 DIAGNOSIS — Z01810 Encounter for preprocedural cardiovascular examination: Secondary | ICD-10-CM | POA: Insufficient documentation

## 2010-11-21 HISTORY — PX: OTHER SURGICAL HISTORY: SHX169

## 2010-11-21 LAB — CBC
HCT: 35.8 — ABNORMAL LOW
HCT: 23.7 — ABNORMAL LOW
HCT: 39.6
Hemoglobin: 12.1 — ABNORMAL LOW
Hemoglobin: 11.9 — ABNORMAL LOW
Hemoglobin: 13.2
Hemoglobin: 8.1 — ABNORMAL LOW
MCHC: 33.9
MCHC: 33.6
MCHC: 33.9
MCHC: 34.3
MCV: 88.4
MCV: 89.6
MCV: 89.6
Platelets: 241
Platelets: 243
RBC: 2.64 — ABNORMAL LOW
RBC: 3.82 — ABNORMAL LOW
RBC: 3.91 — ABNORMAL LOW
RBC: 4.42
RDW: 13.5
RDW: 13.5
WBC: 17.1 — ABNORMAL HIGH
WBC: 20 — ABNORMAL HIGH
WBC: 21.3 — ABNORMAL HIGH

## 2010-11-21 LAB — URINALYSIS, ROUTINE W REFLEX MICROSCOPIC
Bilirubin Urine: NEGATIVE
Ketones, ur: NEGATIVE
Nitrite: NEGATIVE
pH: 7

## 2010-11-21 LAB — DIFFERENTIAL
Basophils Absolute: 0
Basophils Absolute: 0
Basophils Relative: 0
Basophils Relative: 0
Basophils Relative: 0
Eosinophils Absolute: 0
Eosinophils Absolute: 0
Eosinophils Relative: 0
Lymphocytes Relative: 3 — ABNORMAL LOW
Monocytes Absolute: 0.5
Neutro Abs: 19.5 — ABNORMAL HIGH
Neutrophils Relative %: 85 — ABNORMAL HIGH

## 2010-11-21 LAB — SYNOVIAL CELL COUNT + DIFF, W/ CRYSTALS
Crystals, Fluid: NONE SEEN
Monocyte-Macrophage-Synovial Fluid: 0 — ABNORMAL LOW
WBC, Synovial: 140000 — ABNORMAL HIGH

## 2010-11-21 LAB — COMPREHENSIVE METABOLIC PANEL
ALT: 15
AST: 15
Albumin: 3.2 — ABNORMAL LOW
Alkaline Phosphatase: 68
Alkaline Phosphatase: 69
BUN: 7
CO2: 27
CO2: 27
Chloride: 103
Chloride: 95 — ABNORMAL LOW
Chloride: 96
Creatinine, Ser: 0.91
Creatinine, Ser: 0.79
GFR calc Af Amer: >60
GFR calc non Af Amer: >60
GFR calc non Af Amer: >60
Glucose, Bld: 123 — ABNORMAL HIGH
Glucose, Bld: 127 — ABNORMAL HIGH
Potassium: 3.6
Potassium: 3.7
Sodium: 130 — ABNORMAL LOW
Total Bilirubin: 0.9
Total Bilirubin: 0.8
Total Bilirubin: 1
Total Protein: 7.2

## 2010-11-21 LAB — CULTURE, BLOOD (ROUTINE X 2): Culture: NO GROWTH

## 2010-11-21 LAB — BODY FLUID CULTURE

## 2010-11-21 LAB — BASIC METABOLIC PANEL
BUN: 4 — ABNORMAL LOW
CO2: 26
Calcium: 8.1 — ABNORMAL LOW
Chloride: 104
Creatinine, Ser: 0.92
GFR calc Af Amer: >60
GFR calc non Af Amer: >60
Glucose, Bld: 116 — ABNORMAL HIGH
Potassium: 3.5
Sodium: 138

## 2010-11-21 LAB — PROTIME-INR
INR: 1.2
Prothrombin Time: 15.4 — ABNORMAL HIGH

## 2010-11-21 LAB — URINE MICROSCOPIC-ADD ON

## 2010-11-21 LAB — ANAEROBIC CULTURE

## 2010-11-21 LAB — ACETAMINOPHEN LEVEL: Acetaminophen (Tylenol), Serum: <10 — ABNORMAL LOW

## 2010-11-21 LAB — WOUND CULTURE

## 2010-11-21 LAB — SEDIMENTATION RATE: Sed Rate: 87 — ABNORMAL HIGH

## 2010-11-21 LAB — HEMOGLOBIN AND HEMATOCRIT, BLOOD: Hemoglobin: 9.7 — ABNORMAL LOW

## 2010-11-21 LAB — VANCOMYCIN, TROUGH
Vancomycin Tr: 11.2
Vancomycin Tr: 8.1

## 2010-11-22 ENCOUNTER — Emergency Department (HOSPITAL_COMMUNITY)
Admission: EM | Admit: 2010-11-22 | Discharge: 2010-11-23 | Disposition: A | Discharge status: Home / Self Care | Payer: Medicare Other | Attending: Emergency Medicine | Admitting: Emergency Medicine

## 2010-11-22 DIAGNOSIS — R319 Hematuria, unspecified: Secondary | ICD-10-CM | POA: Insufficient documentation

## 2010-11-22 DIAGNOSIS — R112 Nausea with vomiting, unspecified: Secondary | ICD-10-CM | POA: Insufficient documentation

## 2010-11-22 LAB — CBC
HCT: 23.3 — ABNORMAL LOW
HCT: 24.9 — ABNORMAL LOW
HCT: 25.9 — ABNORMAL LOW
Hemoglobin: 8.3 — ABNORMAL LOW
Hemoglobin: 8.7 — ABNORMAL LOW
MCHC: 33.2
MCHC: 33.8
MCHC: 33.8
MCV: 88.9
MCV: 89.8
Platelets: 530 — ABNORMAL HIGH
Platelets: 553 — ABNORMAL HIGH
RBC: 2.9 — ABNORMAL LOW
RBC: 2.91 — ABNORMAL LOW
RDW: 13.4
RDW: 13.5
WBC: 11.3 — ABNORMAL HIGH
WBC: 8.2
WBC: 8.6

## 2010-11-22 LAB — BASIC METABOLIC PANEL
BUN: 6
BUN: 7
CO2: 27
CO2: 29
Chloride: 100
Chloride: 107
Creatinine, Ser: 0.86
Creatinine, Ser: 0.87
GFR calc Af Amer: >60
GFR calc non Af Amer: >60
Glucose, Bld: 104 — ABNORMAL HIGH
Glucose, Bld: 129 — ABNORMAL HIGH
Potassium: 4.1
Potassium: 4.4
Sodium: 137

## 2010-11-22 LAB — CROSSMATCH: ABO/RH(D): O POS

## 2010-11-22 LAB — DIFFERENTIAL
Basophils Relative: 0
Lymphocytes Relative: 22
Lymphs Abs: 2.5
Monocytes Absolute: 0.3
Monocytes Relative: 3
Neutro Abs: 8.4 — ABNORMAL HIGH

## 2010-11-22 LAB — IRON AND TIBC
Iron: 66
UIBC: 113

## 2010-11-22 LAB — ABO/RH: ABO/RH(D): O POS

## 2010-11-23 LAB — COMPREHENSIVE METABOLIC PANEL
ALT: 19 U/L (ref 0–53)
AST: 16 U/L (ref 0–37)
Albumin: 4 g/dL (ref 3.5–5.2)
Alkaline Phosphatase: 72 U/L (ref 39–117)
BUN: 12 mg/dL (ref 6–23)
CO2: 25 mEq/L (ref 19–32)
Calcium: 9.8 mg/dL (ref 8.4–10.5)
Chloride: 103 mEq/L (ref 96–112)
Creatinine, Ser: 0.89 mg/dL (ref 0.50–1.35)
GFR calc Af Amer: >60 mL/min (ref >60)
GFR calc non Af Amer: >60 mL/min (ref >60)
Glucose, Bld: 99 mg/dL (ref 70–99)
Potassium: 3.8 mEq/L (ref 3.5–5.1)
Sodium: 138 mEq/L (ref 135–145)
Total Bilirubin: 0.5 mg/dL (ref 0.3–1.2)
Total Protein: 7.7 g/dL (ref 6.0–8.3)

## 2010-11-23 LAB — URINALYSIS, ROUTINE W REFLEX MICROSCOPIC
Glucose, UA: NEGATIVE mg/dL
Ketones, ur: 40 mg/dL — AB
Nitrite: NEGATIVE
Protein, ur: 100 mg/dL — AB
Specific Gravity, Urine: 1.03 (ref 1.005–1.030)
Urobilinogen, UA: 1 mg/dL (ref 0.0–1.0)
pH: 5.5 (ref 5.0–8.0)

## 2010-11-23 LAB — CBC
HCT: 40.8 % (ref 39.0–52.0)
Hemoglobin: 13.9 g/dL (ref 13.0–17.0)
MCH: 29.6 pg (ref 26.0–34.0)
MCHC: 34.1 g/dL (ref 30.0–36.0)
MCV: 86.8 fL (ref 78.0–100.0)
Platelets: 215 10*3/uL (ref 150–400)
RBC: 4.7 MIL/uL (ref 4.22–5.81)
RDW: 13.7 % (ref 11.5–15.5)
WBC: 8 10*3/uL (ref 4.0–10.5)

## 2010-11-23 LAB — DIFFERENTIAL
Basophils Absolute: 0 10*3/uL (ref 0.0–0.1)
Basophils Relative: 0 % (ref 0–1)
Eosinophils Absolute: 0 10*3/uL (ref 0.0–0.7)
Eosinophils Relative: 1 % (ref 0–5)
Lymphocytes Relative: 25 % (ref 12–46)
Lymphs Abs: 2 10*3/uL (ref 0.7–4.0)
Monocytes Absolute: 0.5 10*3/uL (ref 0.1–1.0)
Monocytes Relative: 6 % (ref 3–12)
Neutro Abs: 5.5 10*3/uL (ref 1.7–7.7)
Neutrophils Relative %: 68 % (ref 43–77)

## 2010-11-23 LAB — URINE MICROSCOPIC-ADD ON

## 2010-11-28 NOTE — Op Note (Signed)
NAMEJADDEN, Michael Morrison NO.:  [id number]  MEDICAL RECORD NO.:  [id number]  LOCATION:  DAYL                         FACILITY:  [facility]  PHYSICIAN:  Heloise Purpura, MD      DATE OF BIRTH:  [birth year]  DATE OF PROCEDURE:  11/21/2010 DATE OF DISCHARGE:                              OPERATIVE REPORT   PREOPERATIVE DIAGNOSES: 1. Microscopic hematuria. 2. Bladder tumor. 3. Right ureteropelvic junction obstruction. 4. 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  PROCEDURES: 1. Cystoscopy. 2. Bilateral retrograde pyelography with interpretation. 3. Pelvic exam under anesthesia. 4. Transurethral resection of bladder tumor (less than 2 cm).  SURGEON:  Heloise Purpura, MD  ANESTHESIA:  General.  COMPLICATIONS:  None.  ESTIMATED BLOOD LOSS:  Minimal.  INDICATIONS:  Michael Morrison is a 50 year old gentleman who presented to me with a diagnosis of a probable right ureteropelvic junction obstruction, but was also noted to have had microscopic hematuria.  For this reason, he underwent office cystoscopy and was found to have a small bladder tumor as well as an atypical cytology and positive FISH test concerning for possible urothelial malignancy.  These findings were discussed with the patient and I recommended proceeding with transurethral resection of his bladder tumor and further evaluation of his urothelium with retrograde pyelography.  The potential risks, complications, and alternative treatment options were discussed in detail and informed consent was obtained.  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  He did not demonstrate any filling defects in the proximal ureter and had a dilated renal pelvis and calyceal system consistent with a ureteropelvic junction obstruction.  Left retrograde pyelography demonstrated a normal-caliber ureter without filling  defects.  There was no evidence of any collecting system abnormalities on the left side.  DESCRIPTION OF PROCEDURE:  The patient was taken to the operating room and a general anesthetic was administered.  He was given preoperative antibiotics, placed in the dorsal lithotomy position, and prepped and draped in the usual sterile fashion.  Next, preoperative time-out was performed.  Cystourethroscopy was performed, which revealed a normal anterior and posterior urethra.  Inspection of the bladder was systematically performed with a 12 and 70 degree lenses.  This revealed a small 0.5 cm pedunculated tumor at the trigone of the bladder just medial to the right ureteral orifice.  No other urothelial abnormalities, tumors, or stones were identified.  The left ureter was then cannulated with a 6-French ureteral catheter and Omnipaque contrast was injected to perform a retrograde pyelogram with findings as dictated above.  An identical procedure was then performed on the contralateral side again with findings as dictated above.  No upper tract urothelial abnormalities were identified that suggested malignancy.  Findings were consistent with a right ureteropelvic junction obstruction.  Attention then returned to the bladder and the cystoscope was removed and replaced with a 28-French resectoscope sheath.  Utilizing loop cautery resection, the bladder tumor was removed along with the underlying mucosa. Additional cold cup deep muscle biopsies were performed of this area. This area was then cauterized for hemostatic purposes.  The bladder was emptied and reinspected and hemostasis  appeared excellent.  His bladder was emptied.  The patient tolerated the procedure well without complications.  A pelvic exam under anesthesia was unremarkable for any bladder masses or prostate abnormalities.  He was able to be transferred to the recovery unit in satisfactory condition.     Heloise Purpura,  MD     LB/MEDQ  D:  11/21/2010  T:  11/22/2010  Job:  [id number]  Electronically Signed by Heloise Purpura MD on 11/28/2010 10:30:40 AM

## 2010-12-02 LAB — CULTURE, ROUTINE-ABSCESS

## 2010-12-13 LAB — COMPREHENSIVE METABOLIC PANEL
ALT: 34
AST: 26
CO2: 25
Chloride: 107
GFR calc Af Amer: >60
GFR calc non Af Amer: >60
Potassium: 4.4
Sodium: 138
Total Bilirubin: 0.5

## 2010-12-13 LAB — URINALYSIS, ROUTINE W REFLEX MICROSCOPIC
Nitrite: NEGATIVE
Specific Gravity, Urine: 1.015
Urobilinogen, UA: 0.2

## 2010-12-13 LAB — LIPASE, BLOOD: Lipase: 26

## 2010-12-13 LAB — CBC
RBC: 4.9
WBC: 6.4

## 2010-12-13 LAB — DIFFERENTIAL
Eosinophils Relative: 1
Lymphocytes Relative: 36
Lymphs Abs: 2.3
Monocytes Relative: 6

## 2011-01-24 ENCOUNTER — Encounter (HOSPITAL_COMMUNITY): Payer: Self-pay | Admitting: Pharmacy Technician

## 2011-01-30 ENCOUNTER — Encounter (HOSPITAL_COMMUNITY): Payer: Self-pay

## 2011-01-30 ENCOUNTER — Encounter (HOSPITAL_COMMUNITY)
Admission: RE | Admit: 2011-01-30 | Discharge: 2011-01-30 | Disposition: A | Discharge status: Home / Self Care | Payer: Medicare Other | Source: Ambulatory Visit | Attending: Urology | Admitting: Urology

## 2011-01-30 HISTORY — DX: Gastro-esophageal reflux disease without esophagitis: K21.9

## 2011-01-30 HISTORY — DX: Crossing vessel and stricture of ureter without hydronephrosis: N13.5

## 2011-01-30 LAB — CBC
HCT: 38.6 % — ABNORMAL LOW (ref 39.0–52.0)
MCH: 29.3 pg (ref 26.0–34.0)
MCV: 87.7 fL (ref 78.0–100.0)
RBC: 4.4 MIL/uL (ref 4.22–5.81)
RDW: 13.7 % (ref 11.5–15.5)
WBC: 7 10*3/uL (ref 4.0–10.5)

## 2011-01-30 LAB — BASIC METABOLIC PANEL
CO2: 23 mEq/L (ref 19–32)
Chloride: 106 mEq/L (ref 96–112)
Creatinine, Ser: 0.9 mg/dL (ref 0.50–1.35)
Glucose, Bld: 97 mg/dL (ref 70–99)

## 2011-01-30 NOTE — Patient Instructions (Addendum)
Michael Morrison  01/30/2011   Your procedure is scheduled on: THURS 12/6  AT 7:15 AM  Report to [facility] at 5:15 AM.  Call this number if you have problems the morning of surgery: [phone]   Remember:CALL DR. BORDEN'S OFFICE TO FIND OUT ABOUT ANY BOWEL PREP YOU MAY NEED TO DO --THE DAY BEFORE YOUR SURGERY   Do not eat food OR DRINK ANYTHING After Midnight NIGHT BEFORE SURGERY         Do not wear jewelry, make-up or nail polish.  Do not wear lotions, powders, or perfumes. You may wear deodorant.  Do not shave 48 hours prior to surgery.  Do not bring valuables to the hospital.  Contacts, dentures or bridgework may not be worn into surgery.  Leave suitcase in the car. After surgery it may be brought to your room.  For patients admitted to the hospital, checkout time is 11:00 AM the day of discharge.   Patients discharged the day of surgery will not be allowed to drive home.  Name and phone number of your driver:      Please read over the following fact sheets that you were given: Blood Transfusion Information and MRSA Information AND INCENTIVE SPIROMETER INFORMATION AND HIBICLENS INFORMATION -SHOWER THE NIGHT BEFORE SURGERY AND AM OF SURGERY

## 2011-01-30 NOTE — Pre-Procedure Instructions (Signed)
PT'S CXR AND EKG WERE DONE AT [facility] 11/18/10 --COPIES ON THIS CHART.

## 2011-02-01 MED ORDER — VANCOMYCIN HCL IN DEXTROSE 1-5 GM/200ML-% IV SOLN
1000.0000 mg | INTRAVENOUS | Status: AC
  Administered 2011-02-02: 1000 mg via INTRAVENOUS
  Filled 2011-02-01: qty 200

## 2011-02-01 NOTE — H&P (Signed)
  History of Present Illness  Michael Morrison is a 50 year old with the following urologic history:  1) Urothelial carcinoma of the bladder: He initially presented to me in September 2012 for an evaluation of his right UPJ obstruction.  He was noted to have microscopic hematuria and was diagnosed with a bladder tumor on cystoscopy. TURBT in September 2012 revealed a diagnosis of low grade, Ta urothelial carcinoma.  Sep 2012: TUR - Low grade, Ta urothelial carcinoma (inverted growth pattern)  2) Right ureteropelvic junction obstruction: He had a history of classic symptoms including right flank pain with Deitl's crisis and was diagnosed with a right UPJ obstruction. Baseline relative renal function of right kidney was 31.1% with a Lasix T 1/2 time of 36.5 cc. His symptoms were reproduced during his baseline renal scan.    Past Medical History Problems  1. History of  Arthritis V13.4 2. History of  Esophageal Reflux 530.81 3. 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 History of  Amputation Of Leg Below Knee Left 2. History of  Ankle Surgery Left 3. History of  Cystoscopy With Fulguration Small Lesion (5-39mm)  Current Meds 1. Advil TABS; Therapy: (Recorded:29Aug2012) to 2. Goody Headache PACK; Therapy: (Recorded:29Aug2012) to  Allergies Medication  1. No Known Drug Allergies  Family History Problems  1. Family history of  Diabetes Mellitus V18.0 2. Family history of  Hypertension V17.49 3. Family history of  Sickle Cell Anemia V18.2  Social History Problems  1. Alcohol Use social 2. Physical Disability Affecting Ability To Work 3. Tobacco Use 305.1 smokes 1/2 ppd for 32 years  Vitals  Blood Pressure: 137 / 71 Temperature: 98.2 F Heart Rate: 62  Physical Exam Constitutional: Well nourished and well developed . No acute distress.  Neck: The appearance of the neck is normal and no neck mass is present.    Pulmonary: No respiratory distress, normal respiratory rhythm and effort and clear bilateral breath sounds.  Abdomen: The abdomen is soft and nontender.        Plan   Right UPJ obstruction: We reviewed his renogram and the indications for proceeding with cystoscopy and stent placement and a right robotic-assisted laparoscopic pyeloplasty. We have previously reviewed the potential risks and complications and expected success rate.

## 2011-02-02 ENCOUNTER — Inpatient Hospital Stay (HOSPITAL_COMMUNITY): Payer: Medicare Other

## 2011-02-02 ENCOUNTER — Inpatient Hospital Stay (HOSPITAL_COMMUNITY)
Admission: RE | Admit: 2011-02-02 | Discharge: 2011-02-03 | DRG: 660 | Disposition: A | Discharge status: Home / Self Care | Payer: Medicare Other | Source: Ambulatory Visit | Attending: Urology | Admitting: Urology

## 2011-02-02 ENCOUNTER — Encounter (HOSPITAL_COMMUNITY): Payer: Self-pay | Admitting: Anesthesiology

## 2011-02-02 ENCOUNTER — Inpatient Hospital Stay (HOSPITAL_COMMUNITY): Payer: Medicare Other | Admitting: Anesthesiology

## 2011-02-02 ENCOUNTER — Encounter (HOSPITAL_COMMUNITY)
Admission: RE | Disposition: A | Discharge status: Home / Self Care | Payer: Self-pay | Source: Ambulatory Visit | Attending: Urology

## 2011-02-02 ENCOUNTER — Encounter (HOSPITAL_COMMUNITY): Payer: Self-pay | Admitting: *Deleted

## 2011-02-02 ENCOUNTER — Other Ambulatory Visit: Payer: Self-pay | Admitting: Urology

## 2011-02-02 DIAGNOSIS — Z8614 Personal history of Methicillin resistant Staphylococcus aureus infection: Secondary | ICD-10-CM

## 2011-02-02 DIAGNOSIS — F17200 Nicotine dependence, unspecified, uncomplicated: Secondary | ICD-10-CM | POA: Diagnosis present

## 2011-02-02 DIAGNOSIS — C680 Malignant neoplasm of urethra: Secondary | ICD-10-CM | POA: Diagnosis present

## 2011-02-02 DIAGNOSIS — N135 Crossing vessel and stricture of ureter without hydronephrosis: Principal | ICD-10-CM | POA: Diagnosis present

## 2011-02-02 DIAGNOSIS — K219 Gastro-esophageal reflux disease without esophagitis: Secondary | ICD-10-CM | POA: Diagnosis present

## 2011-02-02 DIAGNOSIS — M129 Arthropathy, unspecified: Secondary | ICD-10-CM | POA: Diagnosis present

## 2011-02-02 DIAGNOSIS — S88119A Complete traumatic amputation at level between knee and ankle, unspecified lower leg, initial encounter: Secondary | ICD-10-CM

## 2011-02-02 HISTORY — PX: CYSTOSCOPY W/ URETERAL STENT PLACEMENT: SHX1429

## 2011-02-02 HISTORY — PX: ROBOT ASSISTED PYELOPLASTY: SHX5143

## 2011-02-02 LAB — BASIC METABOLIC PANEL
Calcium: 8.9 mg/dL (ref 8.4–10.5)
GFR calc Af Amer: >90 mL/min (ref >90)
GFR calc non Af Amer: >90 mL/min (ref >90)
Glucose, Bld: 154 mg/dL — ABNORMAL HIGH (ref 70–99)
Potassium: 4 mEq/L (ref 3.5–5.1)
Sodium: 137 mEq/L (ref 135–145)

## 2011-02-02 LAB — TYPE AND SCREEN
ABO/RH(D): O POS
Antibody Screen: NEGATIVE

## 2011-02-02 LAB — HEMOGLOBIN AND HEMATOCRIT, BLOOD
HCT: 38.6 % — ABNORMAL LOW (ref 39.0–52.0)
Hemoglobin: 12.7 g/dL — ABNORMAL LOW (ref 13.0–17.0)

## 2011-02-02 SURGERY — ROBOTIC ASSISTED PYELOPLASTY WITH STENT PLACEMENT
Anesthesia: General | Site: Pelvis | Laterality: Right | Wound class: Clean Contaminated

## 2011-02-02 MED ORDER — DOCUSATE SODIUM 100 MG PO CAPS
100.0000 mg | ORAL_CAPSULE | Freq: Two times a day (BID) | ORAL | Status: DC
  Administered 2011-02-02 – 2011-02-03 (×2): 100 mg via ORAL
  Filled 2011-02-02 (×3): qty 1

## 2011-02-02 MED ORDER — LACTATED RINGERS IV SOLN
INTRAVENOUS | Status: DC | PRN
  Administered 2011-02-02 (×3): via INTRAVENOUS

## 2011-02-02 MED ORDER — ONDANSETRON HCL 4 MG/2ML IJ SOLN
4.0000 mg | INTRAMUSCULAR | Status: DC | PRN
  Administered 2011-02-02: 4 mg via INTRAVENOUS
  Filled 2011-02-02: qty 2

## 2011-02-02 MED ORDER — LACTATED RINGERS IV SOLN: INTRAVENOUS | Status: DC

## 2011-02-02 MED ORDER — ACETAMINOPHEN 325 MG PO TABS: 650.0000 mg | ORAL_TABLET | ORAL | Status: DC | PRN

## 2011-02-02 MED ORDER — STERILE WATER FOR IRRIGATION IR SOLN: Status: DC | PRN

## 2011-02-02 MED ORDER — MEPERIDINE HCL 25 MG/ML IJ SOLN: 6.2500 mg | INTRAMUSCULAR | Status: DC | PRN

## 2011-02-02 MED ORDER — CISATRACURIUM BESYLATE 2 MG/ML IV SOLN
INTRAVENOUS | Status: DC | PRN
  Administered 2011-02-02: 4 mg via INTRAVENOUS
  Administered 2011-02-02: 8 mg via INTRAVENOUS
  Administered 2011-02-02: 12 mg via INTRAVENOUS

## 2011-02-02 MED ORDER — GLYCOPYRROLATE 0.2 MG/ML IJ SOLN
INTRAMUSCULAR | Status: DC | PRN
  Administered 2011-02-02: .6 mg via INTRAVENOUS

## 2011-02-02 MED ORDER — KETOROLAC TROMETHAMINE 30 MG/ML IJ SOLN
30.0000 mg | Freq: Four times a day (QID) | INTRAMUSCULAR | Status: DC
  Administered 2011-02-02 – 2011-02-03 (×5): 30 mg via INTRAVENOUS
  Filled 2011-02-02 (×6): qty 1

## 2011-02-02 MED ORDER — NEOSTIGMINE METHYLSULFATE 1 MG/ML IJ SOLN
INTRAMUSCULAR | Status: DC | PRN
  Administered 2011-02-02: 4 mg via INTRAVENOUS

## 2011-02-02 MED ORDER — VANCOMYCIN HCL IN DEXTROSE 1-5 GM/200ML-% IV SOLN
1000.0000 mg | Freq: Two times a day (BID) | INTRAVENOUS | Status: AC
  Administered 2011-02-02: 1000 mg via INTRAVENOUS
  Filled 2011-02-02: qty 200

## 2011-02-02 MED ORDER — BUPIVACAINE-EPINEPHRINE 0.25% -1:200000 IJ SOLN
INTRAMUSCULAR | Status: DC | PRN
  Administered 2011-02-02: 25 mL

## 2011-02-02 MED ORDER — IOHEXOL 300 MG/ML  SOLN
INTRAMUSCULAR | Status: DC | PRN
  Administered 2011-02-02: 10 mL

## 2011-02-02 MED ORDER — MORPHINE SULFATE 2 MG/ML IJ SOLN
2.0000 mg | INTRAMUSCULAR | Status: DC | PRN
  Administered 2011-02-02: 2 mg via INTRAVENOUS
  Filled 2011-02-02 (×2): qty 1

## 2011-02-02 MED ORDER — ZOLPIDEM TARTRATE 5 MG PO TABS: 5.0000 mg | ORAL_TABLET | Freq: Every evening | ORAL | Status: DC | PRN

## 2011-02-02 MED ORDER — MIDAZOLAM HCL 5 MG/5ML IJ SOLN
INTRAMUSCULAR | Status: DC | PRN
  Administered 2011-02-02: 2 mg via INTRAVENOUS

## 2011-02-02 MED ORDER — LACTATED RINGERS IR SOLN
Status: DC | PRN
  Administered 2011-02-02: 1000 mL

## 2011-02-02 MED ORDER — KCL IN DEXTROSE-NACL 20-5-0.45 MEQ/L-%-% IV SOLN
INTRAVENOUS | Status: DC
  Administered 2011-02-02 – 2011-02-03 (×3): via INTRAVENOUS
  Filled 2011-02-02 (×6): qty 1000

## 2011-02-02 MED ORDER — DIPHENHYDRAMINE HCL 50 MG/ML IJ SOLN: 12.5000 mg | Freq: Four times a day (QID) | INTRAMUSCULAR | Status: DC | PRN

## 2011-02-02 MED ORDER — LIDOCAINE HCL (CARDIAC) 20 MG/ML IV SOLN
INTRAVENOUS | Status: DC | PRN
  Administered 2011-02-02: 100 mg via INTRAVENOUS

## 2011-02-02 MED ORDER — ACETAMINOPHEN 10 MG/ML IV SOLN
INTRAVENOUS | Status: DC | PRN
  Administered 2011-02-02: 1000 mg via INTRAVENOUS

## 2011-02-02 MED ORDER — PROPOFOL 10 MG/ML IV EMUL
INTRAVENOUS | Status: DC | PRN
  Administered 2011-02-02: 200 mg via INTRAVENOUS

## 2011-02-02 MED ORDER — DIPHENHYDRAMINE HCL 12.5 MG/5ML PO ELIX: 12.5000 mg | ORAL_SOLUTION | Freq: Four times a day (QID) | ORAL | Status: DC | PRN

## 2011-02-02 MED ORDER — WATER FOR IRRIGATION, STERILE IR SOLN
Status: DC | PRN
  Administered 2011-02-02: 3000 mL

## 2011-02-02 MED ORDER — HYDROMORPHONE HCL PF 1 MG/ML IJ SOLN
0.5000 mg | INTRAMUSCULAR | Status: DC | PRN
  Administered 2011-02-02 (×2): 0.5 mg via INTRAVENOUS

## 2011-02-02 MED ORDER — FENTANYL CITRATE 0.05 MG/ML IJ SOLN
INTRAMUSCULAR | Status: DC | PRN
  Administered 2011-02-02: 200 ug via INTRAVENOUS
  Administered 2011-02-02: 50 ug via INTRAVENOUS
  Administered 2011-02-02: 100 ug via INTRAVENOUS
  Administered 2011-02-02 (×3): 50 ug via INTRAVENOUS

## 2011-02-02 MED ORDER — ONDANSETRON HCL 4 MG/2ML IJ SOLN
INTRAMUSCULAR | Status: DC | PRN
  Administered 2011-02-02: 4 mg via INTRAVENOUS

## 2011-02-02 SURGICAL SUPPLY — 66 items
ADAPTER CATH URET PLST 4-6FR (CATHETERS) ×2 IMPLANT
ADH SKN CLS APL DERMABOND .7 (GAUZE/BANDAGES/DRESSINGS) ×1
ADPR CATH URET STRL DISP 4-6FR (CATHETERS) ×1
BAG URO CATCHER STRL LF (DRAPE) ×2 IMPLANT
CANISTER OMNI JUG 16 LITER (MISCELLANEOUS) ×1 IMPLANT
CANISTER SUCTION 2500CC (MISCELLANEOUS) ×2 IMPLANT
CATH INTERMIT  6FR 70CM (CATHETERS) ×2 IMPLANT
CHLORAPREP W/TINT 26ML (MISCELLANEOUS) ×2 IMPLANT
CLIP LIGATING HEM O LOK PURPLE (MISCELLANEOUS) IMPLANT
CLIP LIGATING HEMO O LOK GREEN (MISCELLANEOUS) IMPLANT
CLOTH BEACON ORANGE TIMEOUT ST (SAFETY) ×2 IMPLANT
CORD HIGH FREQUENCY UNIPOLAR (ELECTROSURGICAL) ×1 IMPLANT
CORDS BIPOLAR (ELECTRODE) ×2 IMPLANT
COVER TIP SHEARS 8 DVNC (MISCELLANEOUS) ×1 IMPLANT
COVER TIP SHEARS 8MM DA VINCI (MISCELLANEOUS) ×1
DECANTER SPIKE VIAL GLASS SM (MISCELLANEOUS) ×1 IMPLANT
DERMABOND ADVANCED (GAUZE/BANDAGES/DRESSINGS) ×1
DERMABOND ADVANCED .7 DNX12 (GAUZE/BANDAGES/DRESSINGS) IMPLANT
DRAIN CHANNEL 15F RND FF 3/16 (WOUND CARE) ×2 IMPLANT
DRAPE CAMERA CLOSED 9X96 (DRAPES) ×2 IMPLANT
DRAPE INCISE IOBAN 66X45 STRL (DRAPES) ×2 IMPLANT
DRAPE LAPAROSCOPIC ABDOMINAL (DRAPES) ×2 IMPLANT
DRAPE LG THREE QUARTER DISP (DRAPES) ×2 IMPLANT
DRAPE TABLE BACK 44X90 PK DISP (DRAPES) ×2 IMPLANT
DRAPE UTILITY XL STRL (DRAPES) ×1 IMPLANT
DRAPE WARM FLUID 44X44 (DRAPE) ×2 IMPLANT
ELECT REM PT RETURN 9FT ADLT (ELECTROSURGICAL) ×2
ELECTRODE REM PT RTRN 9FT ADLT (ELECTROSURGICAL) ×1 IMPLANT
EVACUATOR SILICONE 100CC (DRAIN) ×1 IMPLANT
GAUZE VASELINE 3X9 (GAUZE/BANDAGES/DRESSINGS) IMPLANT
GLOVE BIOGEL M STRL SZ7.5 (GLOVE) ×6 IMPLANT
GOWN STRL NON-REIN LRG LVL3 (GOWN DISPOSABLE) ×10 IMPLANT
GUIDEWIRE STR DUAL SENSOR (WIRE) ×2 IMPLANT
KIT ACCESSORY DA VINCI DISP (KITS) ×1
KIT ACCESSORY DVNC DISP (KITS) ×1 IMPLANT
KIT BASIN OR (CUSTOM PROCEDURE TRAY) ×2 IMPLANT
LUBRICANT JELLY ST 5GR 8946 (MISCELLANEOUS) ×4 IMPLANT
MANIFOLD NEPTUNE II (INSTRUMENTS) ×1 IMPLANT
NS IRRIG 1000ML POUR BTL (IV SOLUTION) ×2 IMPLANT
PACK CYSTO (CUSTOM PROCEDURE TRAY) ×2 IMPLANT
PEN SKIN MARKING BROAD (MISCELLANEOUS) ×2 IMPLANT
PENCIL BUTTON HOLSTER BLD 10FT (ELECTRODE) ×2 IMPLANT
POSITIONER SURGICAL ARM (MISCELLANEOUS) ×3 IMPLANT
SET TUBE IRRIG SUCTION NO TIP (IRRIGATION / IRRIGATOR) IMPLANT
SOLUTION ANTI FOG 6CC (MISCELLANEOUS) ×2 IMPLANT
SOLUTION ELECTROLUBE (MISCELLANEOUS) ×2 IMPLANT
SPONGE LAP 18X18 X RAY DECT (DISPOSABLE) ×1 IMPLANT
STAPLER VISISTAT 35W (STAPLE) ×2 IMPLANT
STENT CONTOUR NO GW 8FR 26CM (STENTS) ×1 IMPLANT
SUT ETHILON 3 0 PS 1 (SUTURE) ×2 IMPLANT
SUT MNCRL AB 4-0 PS2 18 (SUTURE) ×4 IMPLANT
SUT VIC AB 0 CT1 27 (SUTURE) ×2
SUT VIC AB 0 CT1 27XBRD ANTBC (SUTURE) ×3 IMPLANT
SUT VIC AB 0 UR5 27 (SUTURE) ×3 IMPLANT
SUT VIC AB 4-0 RB1 27 (SUTURE) ×10
SUT VIC AB 4-0 RB1 27XBRD (SUTURE) ×6 IMPLANT
SUT VICRYL 0 UR6 27IN ABS (SUTURE) ×5 IMPLANT
SYR BULB IRRIGATION 50ML (SYRINGE) IMPLANT
TOWEL OR NON WOVEN STRL DISP B (DISPOSABLE) ×2 IMPLANT
TRAY FOLEY CATH 14FRSI W/METER (CATHETERS) ×2 IMPLANT
TRAY LAP CHOLE (CUSTOM PROCEDURE TRAY) ×2 IMPLANT
TROCAR ENDOPATH XCEL 12X100 BL (ENDOMECHANICALS) ×2 IMPLANT
TROCAR XCEL 12X100 BLDLESS (ENDOMECHANICALS) ×2 IMPLANT
TUBING CONNECTING 10 (TUBING) ×2 IMPLANT
TUBING INSUFFLATION 10FT LAP (TUBING) ×2 IMPLANT
WATER STERILE IRR 1500ML POUR (IV SOLUTION) ×4 IMPLANT

## 2011-02-02 NOTE — Anesthesia Procedure Notes (Signed)
Procedure Name: Intubation Date/Time: 02/02/2011 7:37 AM Performed by: Lurlean Leyden, Maylani Embree L. Patient Re-evaluated:Patient Re-evaluated prior to inductionOxygen Delivery Method: Circle System Utilized Preoxygenation: Pre-oxygenation with 100% oxygen Intubation Type: IV induction Ventilation: Mask ventilation without difficulty and Oral airway inserted - appropriate to patient size Laryngoscope Size: Miller and 3 Grade View: Grade II Tube type: Oral Tube size: 8.0 mm Number of attempts: 1 Airway Equipment and Method: stylet Placement Confirmation: ETT inserted through vocal cords under direct vision,  breath sounds checked- equal and bilateral and positive ETCO2 Secured at: 23 cm Tube secured with: Tape Dental Injury: Teeth and Oropharynx as per pre-operative assessment

## 2011-02-02 NOTE — Addendum Note (Signed)
Addendum  created 02/02/11 1657 by Corlis Angelica L. Bryli Mantey   Modules edited:Anesthesia Medication Administration    

## 2011-02-02 NOTE — Op Note (Signed)
Preoperative diagnosis: Right ureteropelvic junction obstruction  Postoperative diagnosis: Right ureteropelvic junction obstruction  Procedure:  1. Cystoscopy 2. Right retrograde pyelography with interpretation 3. Right ureteral stent placement (8 x 26) 4. Right robotic-assisted laparoscopic dismembered pyeloplasty  Surgeon: Moody Bruins. M.D.  Assistant(s): Pecola Leisure, PA  Anesthesia: General  Complications: None  EBL: 50 mL  IVF:  2200 mL crystalloid  Specimens: 1. Ureteropelvic junction  Disposition of specimens: Pathology  Intraoperative findings: A lower pole crossing renal artery was identified as the clear etiology of his UPJ obstruction. Retrograde pyelogram findings demonstrated a normal caliber ureter without filling defects. Air bubbles were noted on the films sent to radiology. He had a tortuous proximal ureter and a dilated renal pelvis consistent with a UPJ obstruction.  Drains:  1. # 15 Blake perinephric drain 2. 16 Fr Foley catheter  Indication: Michael Morrison is a 50 y.o. patient with a suspected right ureteropelvic junction.  After a thorough review of the management options for their ureteropelvic junction obstruction, they elected to proceed with surgical treatment and the above procedure.  We have discussed the potential benefits and risks of the procedure, side effects of the proposed treatment, the likelihood of the patient achieving the goals of the procedure, and any potential problems that might occur during the procedure or recuperation. Informed consent has been obtained.  Description of procedure:  The patient was taken to the operating room and a general anesthetic was administered. The patient was given preoperative antibiotics, placed in the dorsal lithotomy position, and prepped and draped in the usual sterile fashion. Next a preoperative timeout was performed.  Cystourethroscopy was performed.  The patient's urethra was examined  and was normal. The bladder was then systematically examined in its entirety. There was no evidence for any bladder tumors, stones, or other mucosal pathology.    Attention then turned to the right ureteral orifice and a ureteral catheter was used to intubate the ureteral orifice.  Omnipaque contrast was injected through the ureteral catheter and a retrograde pyelogram was performed with findings as dictated above.  A 0.38 sensor guidewire was then advanced up the right ureter into the renal pelvis under fluoroscopic guidance.  The wire was then backloaded through the cystoscope and a ureteral stent was advance over the wire using Seldinger technique.  The stent was positioned appropriately under fluoroscopic and cystoscopic guidance. There was some resistance noted placing the 8 Fr stent but it was passed without complication.  The wire was then removed with an adequate stent curl noted in the renal pelvis as well as in the bladder. The bladder was then emptied and a 16 Fr Foley catheter was inserted.   The patient was then repositioned in the right modified flank position and prepped and draped in the usual sterile fashion. A site was selected on the right side of the umbilicus for placement of the camera port. This was placed using a standard open Hassan technique which allowed entry into the peritoneal cavity under direct vision and without difficulty. A 12 mm port was placed and a pneumoperitoneum established. The camera was then used to inspect the abdomen and there was no evidence of any intra-abdominal injuries or other abnormalities. The remaining abdominal ports were then placed. 8 mm robotic ports were placed in the ipsilateral upper quadrant, lower quadrant, and far lateral abdominal wall. A 12 mm port was placed in the upper midline for laparoscopic assistance. All ports were placed under direct vision without difficulty. The surgical  cart was then docked.   Utilizing the cautery scissors, the  white line of Toldt was incised allowing the plane between the mesocolon and the anterior layer of Gerota's fascia to be developed and the kidney exposed.  The ureter and gonadal vein were identified inferiorly and the ureter was lifted anteriorly off the psoas muscle.  Dissection proceeded superiorly along the gonadal vein until the main renal hilum and renal pelvis was identified.  The ureteropelvic junction was isolated from the surrounding structures with a combination of sharp and blunt dissection.    There was noted to be a lower pole crossing renal vessel. The ureteropelvic junction was divided allowing exposure of the indwelling stent and the ureteropelvic junction was excised and removed. The ureter and renal pelvis were then transposed anterior to the crossing lower pole renal vessel.  The ureter and renal pelvis were then examined.  Any excess renal pelvis was excised as needed and the renal pelvis and ureter were then spatulated appropriately.  4-0 vicryl sutures were then used to reapproximate the renal pelvis and ureter with interrupted and/or running sutures as indicated.  The anastomosis was performed in a tension-free, watertight fashion with the ureter reconnected to the renal pelvis in a position to provide dependent drainage of the renal collecting system. The ureteral stent was repositioned appropriately prior to securing the final sutures of the ureteropelvic anastomosis.  A # 15 Blake perinephric drain was then brought through the lateral lower abdominal port site and positioned appropriately.  It was secured to the skin with a nylon suture.  The 12 mm port sites were then closed with 0-vicryl sutures placed laparoscopically with the laparoscopic suture passer. All remaining ports were removed under direct vision after hemostasis was confirmed with the pneumoperiotneum let down. All port sites were injected with 0.25% bupivicaine and reapproximated at the skin level with 4-0 monocryl  subcuticular sutures. Dermabond was applied to the skin.  The patient appeared to tolerate the procedure well and without complications.  The patient was able to be extubated and transferred to the recovery unit in satisfactory condition.  Moody Bruins MD

## 2011-02-02 NOTE — Preoperative (Signed)
Beta Blockers   Reason not to administer Beta Blockers:Not Applicable 

## 2011-02-02 NOTE — Anesthesia Preprocedure Evaluation (Addendum)
Anesthesia Evaluation  Patient identified by MRN, date of birth, ID band Patient awake    Reviewed: Allergy & Precautions, H&P , NPO status , Patient's Chart, lab work & pertinent test results  Airway Mallampati: II TM Distance: >3 FB Neck ROM: Full    Dental No notable dental hx.    Pulmonary neg pulmonary ROS,  clear to auscultation  Pulmonary exam normal       Cardiovascular neg cardio ROS Regular Normal    Neuro/Psych Negative Neurological ROS  Negative Psych ROS   GI/Hepatic negative GI ROS, Neg liver ROS, GERD-  ,  Endo/Other  Negative Endocrine ROS  Renal/GU negative Renal ROS  Genitourinary negative   Musculoskeletal negative musculoskeletal ROS (+)   Abdominal   Peds negative pediatric ROS (+)  Hematology negative hematology ROS (+)   Anesthesia Other Findings   Reproductive/Obstetrics negative OB ROS                          Anesthesia Physical Anesthesia Plan  ASA: II  Anesthesia Plan: General   Post-op Pain Management:    Induction: Intravenous  Airway Management Planned: Oral ETT  Additional Equipment:   Intra-op Plan:   Post-operative Plan: Extubation in OR  Informed Consent: I have reviewed the patients History and Physical, chart, labs and discussed the procedure including the risks, benefits and alternatives for the proposed anesthesia with the patient or authorized representative who has indicated his/her understanding and acceptance.   Dental advisory given  Plan Discussed with: CRNA  Anesthesia Plan Comments:        Anesthesia Quick Evaluation  

## 2011-02-02 NOTE — Transfer of Care (Signed)
Immediate Anesthesia Transfer of Care Note  Patient: Michael Morrison  Procedure(s) Performed:  ROBOTIC ASSISTED PYELOPLASTY WITH STENT PLACEMENT - Cystoscopy Right Retrograde Right Ureteral Stent    (c-arm) ; CYSTOSCOPY WITH RETROGRADE PYELOGRAM/URETERAL STENT PLACEMENT - Cystoscopy Right Retrograde Right Ureteral Stent    (c-arm)   Patient Location: PACU  Anesthesia Type: General  Level of Consciousness: awake, alert  and oriented  Airway & Oxygen Therapy: Patient Spontanous Breathing and Patient connected to face mask oxygen  Post-op Assessment: Report given to PACU RN  Post vital signs: Reviewed and stable  Complications: No apparent anesthesia complications

## 2011-02-02 NOTE — Progress Notes (Signed)
Day of Surgery Subjective: Patient reports pain control good. No N/V.  Objective: Vital signs in last 24 hours: Temp:  [96.8 F (36 C)-98.3 F (36.8 C)] 97.4 F (36.3 C) (12/06 1425) Pulse Rate:  [48-71] 52  (12/06 1425) Resp:  [8-20] 16  (12/06 1425) BP: (116-218)/(68-97) 164/93 mmHg (12/06 1425) SpO2:  [97 %-100 %] 97 % (12/06 1425) Weight:  [92.4 kg (203 lb 11.3 oz)] 203 lb 11.3 oz (92.4 kg) (12/06 1425)  Intake/Output this shift: Total I/O In: 3015 [I.V.:2700; Other:315] Out: 275 [Urine:225; Blood:50]  Physical Exam:  General:alert, cooperative and no distress Cardiovascular: RRR Lungs: decreased BS bases GI: soft Incisions: C/D/I  Lab Results:  Basename 02/02/11 1055  HGB 12.7*  HCT 38.6*   BMET  Basename 02/02/11 1055  NA 137  K 4.0  CL 103  CO2 25  GLUCOSE 154*  BUN 9  CREATININE 0.99  CALCIUM 8.9    Results for orders placed during the hospital encounter of 01/30/11  SURGICAL PCR SCREEN     Status: Normal   Collection Time   01/30/11  9:27 AM      Component Value Range Status Comment   MRSA, PCR NEGATIVE  NEGATIVE  Final    Staphylococcus aureus NEGATIVE  NEGATIVE  Final     Studies/Results: Dg Abd 1 View  02/02/2011  *RADIOLOGY REPORT*  Clinical Data: Intraop cysto with right-sided stent placement  ABDOMEN - 1 VIEW  Comparison: None.  Findings: On the first film, contrast is seen within a nondilated right ureter and into a dilated right renal pelvis.  On the second film, the proximal aspect of the right ureteral stent is noted over the right renal pelvis.  IMPRESSION: The proximal aspect of the stent is at the right renal pelvis level.  Original Report Authenticated By: Brandon Melnick, M.D.   Dg Abd Portable 1v  02/02/2011  *RADIOLOGY REPORT*  Clinical Data: Postop stent placement  ABDOMEN - 1 VIEW  Comparison: CT abdomen and pelvis of 11/09/2010 a right retrograde study of 02/02/2011  Findings: A right double-J stent is present and appears to be  in good position by plain film.  A catheter overlies the right abdomen which may represent a nephrostomy catheter.  The bowel gas pattern is nonspecific.  No opaque calculi are noted.  IMPRESSION: Right double-J stent appears to be in good position.  Bowel gas pattern is nonspecific  Original Report Authenticated By: Juline Patch, M.D.   Dg C-arm 1-60 Min-no Report  02/02/2011  CLINICAL DATA: intra op   C-ARM 1-60 MINUTES  Fluoroscopy was utilized by the requesting physician.  No radiographic  interpretation.      Assessment/Plan: Day of Surgery, Procedure(s) (LRB): ROBOTIC ASSISTED PYELOPLASTY WITH STENT PLACEMENT (Right) CYSTOSCOPY WITH RETROGRADE PYELOGRAM/URETERAL STENT PLACEMENT (Right)  Ambulate, Incentive spirometry DVT prophylaxis Transition to PO pain medications Clear liquids   LOS: 0 days   Morrison,Michael Lowdermilk G. 02/02/2011, 4:15 PM

## 2011-02-02 NOTE — Addendum Note (Signed)
Addendum  created 02/02/11 1657 by Darci Needle. Michael Morrison   Modules edited:Anesthesia Medication Administration

## 2011-02-02 NOTE — Anesthesia Postprocedure Evaluation (Signed)
  Anesthesia Post-op Note  Patient: Michael Morrison  Procedure(s) Performed:  ROBOTIC ASSISTED PYELOPLASTY WITH STENT PLACEMENT - Cystoscopy Right Retrograde Right Ureteral Stent    (c-arm) ; CYSTOSCOPY WITH RETROGRADE PYELOGRAM/URETERAL STENT PLACEMENT - Cystoscopy Right Retrograde Right Ureteral Stent    (c-arm)   Patient Location: PACU  Anesthesia Type: General  Level of Consciousness: awake and alert   Airway and Oxygen Therapy: Patient Spontanous Breathing  Post-op Pain: mild  Post-op Assessment: Post-op Vital signs reviewed, Patient's Cardiovascular Status Stable, Respiratory Function Stable, Patent Airway and No signs of Nausea or vomiting  Post-op Vital Signs: stable  Complications: No apparent anesthesia complications

## 2011-02-02 NOTE — Plan of Care (Signed)
Problem: Diagnosis - Type of Surgery Goal: General Surgical Patient Education (See Patient Education module for education specifics) Outcome: Completed/Met Date Met:  02/02/11 Robotic Assisted Pyeloplasty

## 2011-02-03 ENCOUNTER — Encounter (HOSPITAL_COMMUNITY): Payer: Self-pay | Admitting: Urology

## 2011-02-03 LAB — CREATININE, FLUID (PLEURAL, PERITONEAL, JP DRAINAGE): Creat, Fluid: 1 mg/dL

## 2011-02-03 LAB — BASIC METABOLIC PANEL
CO2: 24 mEq/L (ref 19–32)
Calcium: 8.5 mg/dL (ref 8.4–10.5)
Creatinine, Ser: 0.97 mg/dL (ref 0.50–1.35)
GFR calc Af Amer: >90 mL/min (ref >90)
GFR calc non Af Amer: >90 mL/min (ref >90)
Sodium: 134 mEq/L — ABNORMAL LOW (ref 135–145)

## 2011-02-03 LAB — HEMOGLOBIN AND HEMATOCRIT, BLOOD
HCT: 33.2 % — ABNORMAL LOW (ref 39.0–52.0)
Hemoglobin: 10.9 g/dL — ABNORMAL LOW (ref 13.0–17.0)

## 2011-02-03 MED ORDER — HYDROCODONE-ACETAMINOPHEN 5-325 MG PO TABS: 1.0000 | ORAL_TABLET | Freq: Four times a day (QID) | ORAL | Status: DC | PRN

## 2011-02-03 MED ORDER — BISACODYL 10 MG RE SUPP
10.0000 mg | Freq: Once | RECTAL | Status: AC
  Administered 2011-02-03: 10 mg via RECTAL
  Filled 2011-02-03: qty 1

## 2011-02-03 MED ORDER — HYDROCODONE-ACETAMINOPHEN 5-500 MG PO TABS: 1.0000 | ORAL_TABLET | Freq: Four times a day (QID) | ORAL | Status: DC | PRN

## 2011-02-03 NOTE — Discharge Summary (Signed)
Date of admission: 02/02/2011  Date of discharge: 02/03/2011  Admission diagnosis: Right UPJ obstruction   Discharge diagnosis: same  Secondary diagnoses: arthritis, GERD, MRSA, left BKA  History and Physical: For full details, please see admission history and physical. Briefly, Michael Morrison is a 50 y.o. year old patient with the following urologic history:  1) Urothelial carcinoma of the bladder: He initially presented to me in September 2012 for an evaluation of his right UPJ obstruction. He was noted to have microscopic hematuria and was diagnosed with a bladder tumor on cystoscopy. TURBT in September 2012 revealed a diagnosis of low grade, Ta urothelial carcinoma.  Sep 2012: TUR - Low grade, Ta urothelial carcinoma (inverted growth pattern)  2) Right ureteropelvic junction obstruction: He had a history of classic symptoms including right flank pain with Deitl's crisis and was diagnosed with a right UPJ obstruction. Baseline relative renal function of right kidney was 31.1% with a Lasix T 1/2 time of 36.5 cc. His symptoms were reproduced during his baseline renal scan. Secondary to UPJ obstruction pt was scheduled for cysto, right sided DJ stent placement and robotic assisted pyeloplasty.      Hospital Course:  Pt was admitted and taken to the OR on 02/02/11 for cysto, right sided DJ stent placement, and robotic assisted right pyeloplasty.  Pt tolerated the procedure well and was hemodynamically stable immediately post op.  He was extubated without complication and woke up from anesthesia neurologically intact.  His post op course has progressed as expected.  He remained afebrile with stable vital signs.  His has had good pain control.  He did have a slight drop in H/H post op but repeat value showed this to stabilize.  His JP creatinine was WNL.  Patient has been able to ambulate and tolerate a regular diet without difficulty.  Foley was removed and he has also been able to void successfully.  He  does have gross hematuria without clots. Pt is doing well and felt stable for d/c home.  Laboratory values:  Basename 02/03/11 1047 02/03/11 0447 02/02/11 1055  HGB 11.4* 10.9* 12.7*  HCT 34.3* 33.2* 38.6*   Lab Results  Component Value Date   CREATININE 0.97 02/03/2011   Disposition: Home  Discharge instruction: The patient was instructed to be ambulatory but to refrain from heavy lifting, strenuous activity, or driving. He was instructed to increase his water/fluid intake while he has gross hematuria to flush his bladder and prevent clots.    Discharge medications:    Michael Morrison, Michael Morrison  Home Medication Instructions ZOX:[id number]   Printed on:02/03/11 1356  Medication Information                    Omega-3 Fatty Acids (FISH OIL) 500 MG CAPS Take 500 mg by mouth daily.             Multiple Vitamins-Minerals (MULTIVITAMINS THER. W/MINERALS) TABS Take 1 tablet by mouth daily.             clotrimazole-betamethasone (LOTRISONE) cream Apply 1 application topically 2 (two) times daily.             naphazoline (CLEAR EYES) 0.012 % ophthalmic solution Place 1 drop into both eyes 3 (three) times daily as needed. For dry eyes            cyanocobalamin 500 MCG tablet Take 500 mcg by mouth daily.            doxycycline (VIBRAMYCIN) 100 MG capsule Take 100  mg by mouth 2 (two) times daily.             HYDROcodone-acetaminophen (VICODIN) 5-500 MG per tablet Take 1-2 tablets by mouth every 6 (six) hours as needed. For pain            Followup:  Follow-up Information    Follow up with Crecencio Mc, MD on 03/01/2011. (at 12:45)    Contact information:   [address] [place] [facility] [place] [address] [phone]

## 2011-02-03 NOTE — Progress Notes (Signed)
Patient ID: Michael Morrison, male   DOB: [birth year], 50 y.o.   MRN: [id number] 1 Day Post-Op Subjective: The patient is doing well.  No nausea or vomiting. Pain is adequately controlled.  Objective: Vital signs in last 24 hours: Temp:  [96.8 F (36 C)-98.9 F (37.2 C)] 98.8 F (37.1 C) (12/07 0625) Pulse Rate:  [48-71] 65  (12/07 0625) Resp:  [8-16] 16  (12/07 0625) BP: (117-218)/(68-97) 117/70 mmHg (12/07 0625) SpO2:  [97 %-100 %] 100 % (12/07 0625) Weight:  [92.4 kg (203 lb 11.3 oz)] 203 lb 11.3 oz (92.4 kg) (12/06 1425)  Intake/Output from previous day: 12/06 0701 - 12/07 0700 In: 5072 [I.V.:4535; IV Piggyback:200] Out: 3090 [Urine:3000; Drains:40; Blood:50] Intake/Output this shift:    Physical Exam:  General: Alert and oriented. CV: RRR Lungs: Clear bilaterally. GI: Soft, Nondistended. Incisions: Dry and intact. Urine: Clear Extremities: Nontender, no erythema, no edema.  Lab Results:  Basename 02/03/11 0447 02/02/11 1055  HGB 10.9* 12.7*  HCT 33.2* 38.6*      Assessment/Plan: POD# 1 s/p robotic prostatectomy.  1) Decrease IVF 2) Ambulate, Incentive spirometry 3) Transition to oral pain medication 4) Dulcolax suppository 5) D/C Foley and check JP cr 6) Recheck H/H 7) Plan for likely discharge later today   Moody Bruins. MD   LOS: 1 day   Edynn Gillock,LES 02/03/2011, 8:04 AM

## 2011-06-01 ENCOUNTER — Other Ambulatory Visit (HOSPITAL_COMMUNITY): Payer: Self-pay | Admitting: Urology

## 2011-06-27 ENCOUNTER — Encounter (HOSPITAL_COMMUNITY)
Admission: RE | Admit: 2011-06-27 | Discharge: 2011-06-27 | Disposition: A | Discharge status: Home / Self Care | Payer: Medicare Other | Source: Ambulatory Visit | Attending: Urology | Admitting: Urology

## 2011-06-27 DIAGNOSIS — N1330 Unspecified hydronephrosis: Secondary | ICD-10-CM | POA: Insufficient documentation

## 2011-06-27 MED ORDER — TECHNETIUM TC 99M MERTIATIDE
15.2000 | Freq: Once | INTRAVENOUS | Status: AC | PRN
  Administered 2011-06-27: 15 via INTRAVENOUS

## 2011-06-27 MED ORDER — FUROSEMIDE 10 MG/ML IJ SOLN
40.0000 mg | Freq: Once | INTRAMUSCULAR | Status: DC
  Filled 2011-06-27: qty 4

## 2012-03-12 ENCOUNTER — Other Ambulatory Visit (HOSPITAL_COMMUNITY): Payer: Self-pay | Admitting: Urology

## 2012-03-12 DIAGNOSIS — N135 Crossing vessel and stricture of ureter without hydronephrosis: Secondary | ICD-10-CM

## 2012-04-22 ENCOUNTER — Encounter (HOSPITAL_COMMUNITY)
Admission: RE | Admit: 2012-04-22 | Discharge: 2012-04-22 | Disposition: A | Discharge status: Home / Self Care | Payer: Medicare Other | Source: Ambulatory Visit | Attending: Urology | Admitting: Urology

## 2012-04-22 DIAGNOSIS — N135 Crossing vessel and stricture of ureter without hydronephrosis: Secondary | ICD-10-CM | POA: Insufficient documentation

## 2012-04-22 DIAGNOSIS — N1330 Unspecified hydronephrosis: Secondary | ICD-10-CM | POA: Insufficient documentation

## 2012-04-22 MED ORDER — TECHNETIUM TC 99M MERTIATIDE
15.0000 | Freq: Once | INTRAVENOUS | Status: AC | PRN
  Administered 2012-04-22: 15 via INTRAVENOUS

## 2012-04-22 MED ORDER — FUROSEMIDE 10 MG/ML IJ SOLN
45.0000 mg | Freq: Once | INTRAMUSCULAR | Status: AC
  Administered 2012-04-22: 45 mg via INTRAVENOUS
  Filled 2012-04-22: qty 6

## 2013-03-04 IMAGING — CT CT ABD-PELV W/O CM
2 of 4 series · 17 of 46 positions shown, 19 images · non-contrast
Comparison: None.

CLINICAL DATA: Right flank pain

CT ABDOMEN AND PELVIS WITHOUT CONTRAST
TECHNIQUE: Multidetector CT imaging of the abdomen and pelvis was
performed following the standard protocol without intravenous
contrast.

[Series 2: a/p w/o 5.0 b31f st · axial · non-contrast · 0.67mm/px · z∈[-449,-59]mm · 14 of 86 slices shown, 16 images]
[im 4/86  soft-tissue]
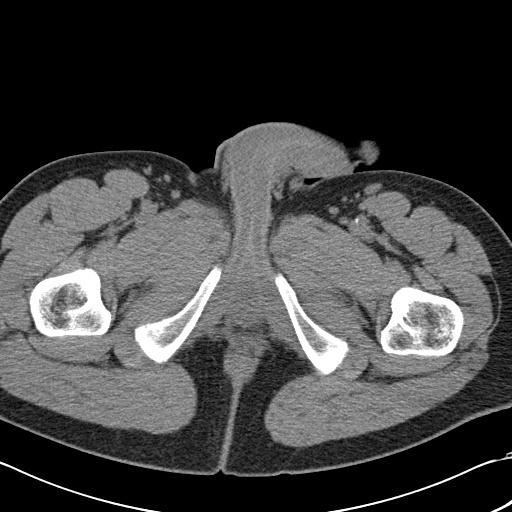
[im 4/86  bone]
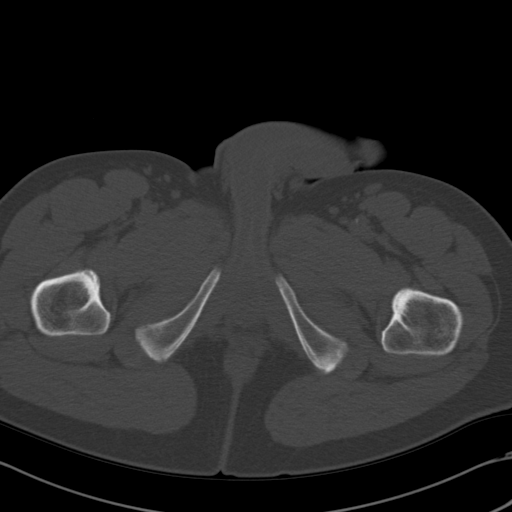
[im 11/86  soft-tissue]
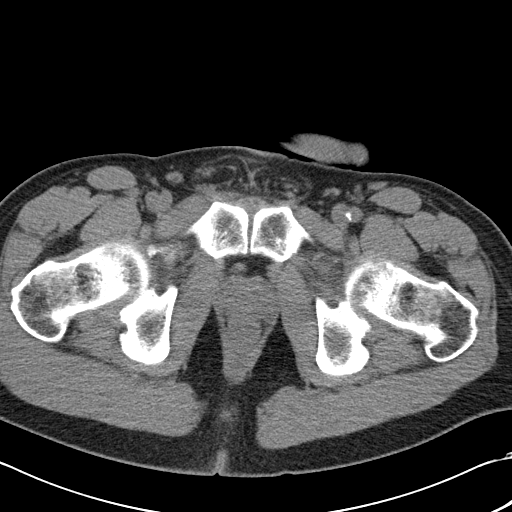
[im 18/86  soft-tissue]
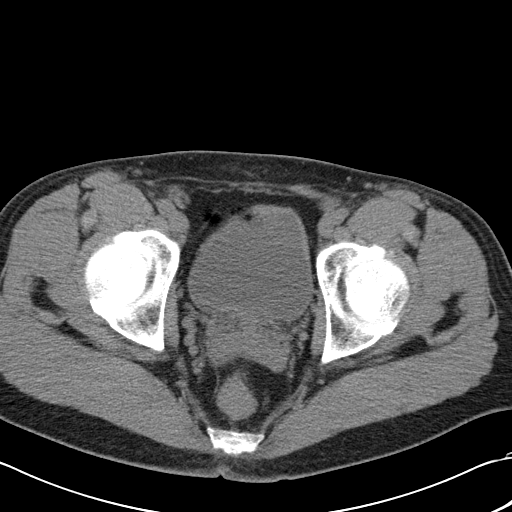
[im 24/86  soft-tissue]
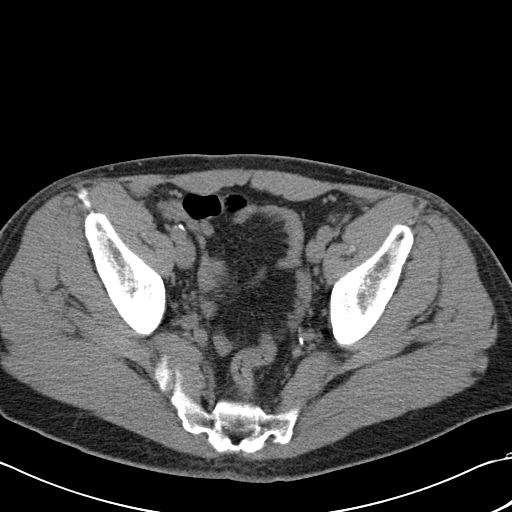
[im 28/86  soft-tissue]
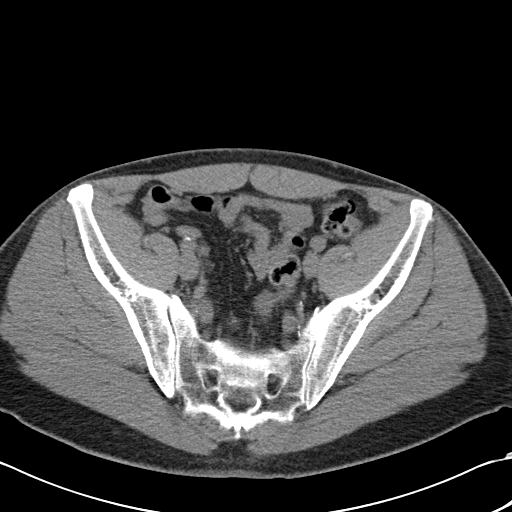
[im 35/86  soft-tissue]
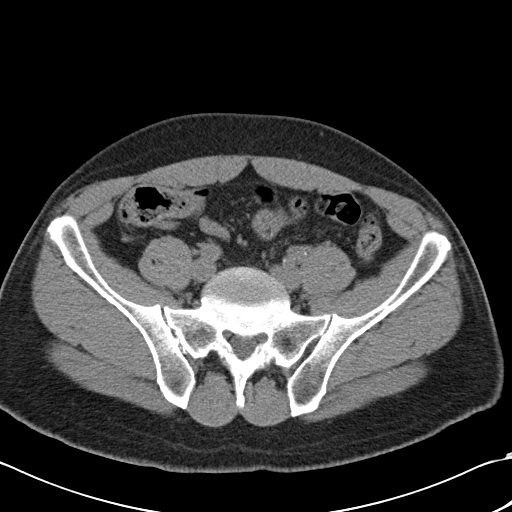
[im 41/86  soft-tissue]
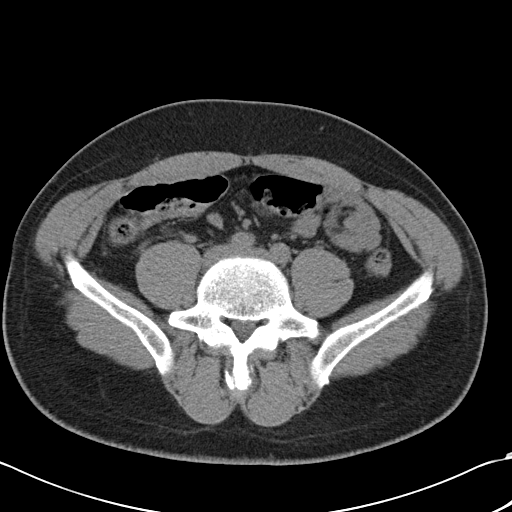
[im 45/86  soft-tissue]
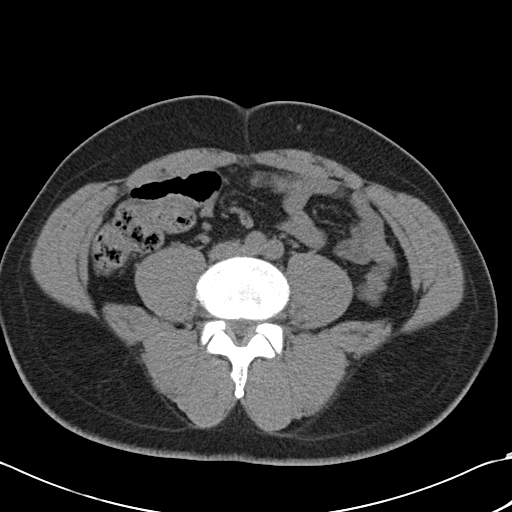
[im 52/86  soft-tissue]
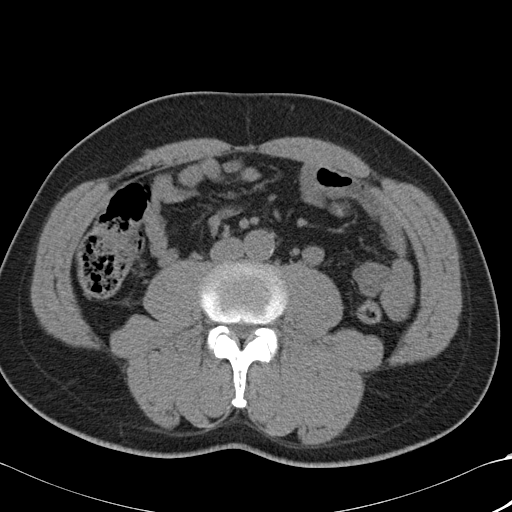
[im 52/86  bone]
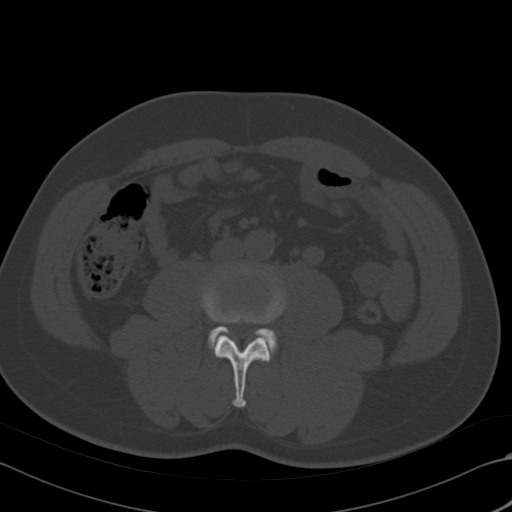
[im 58/86  soft-tissue]
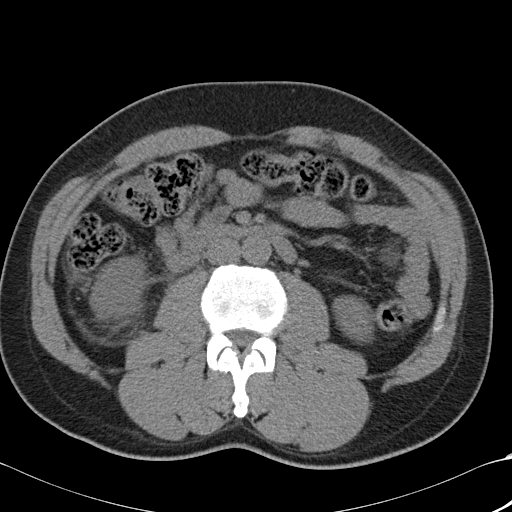
[im 65/86  soft-tissue]
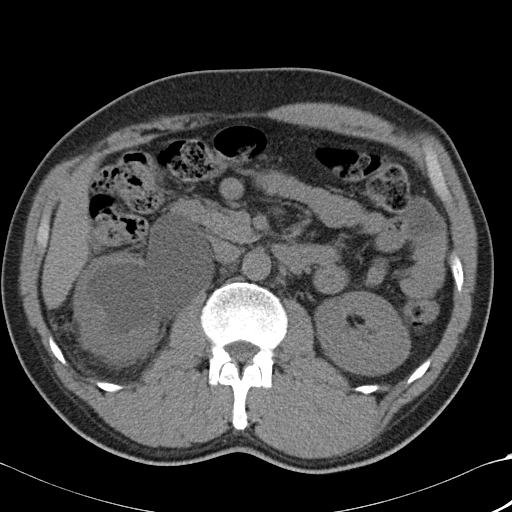
[im 69/86  soft-tissue]
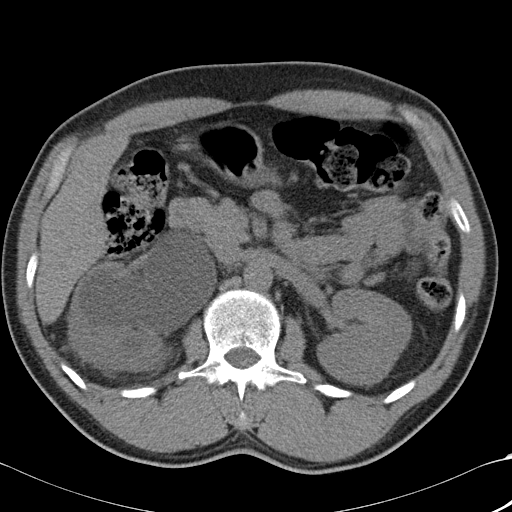
[im 75/86  soft-tissue]
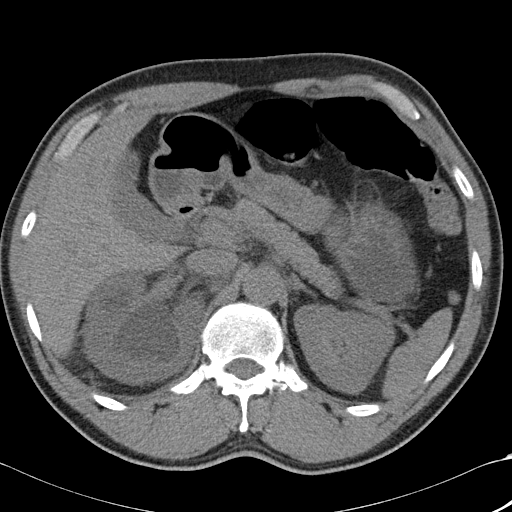
[im 82/86  soft-tissue]
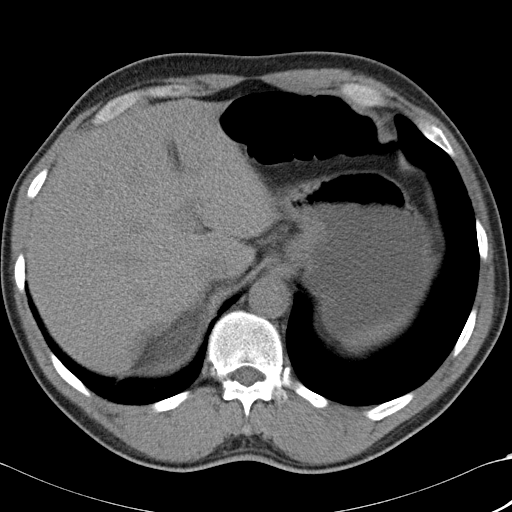

[Series 602: coronals · coronal · 0.83mm/px · 3 of 79 slices shown]
[im 27/79  soft-tissue]
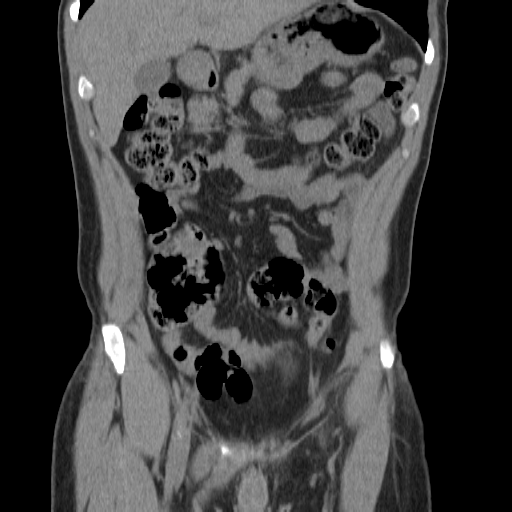
[im 35/79  soft-tissue]
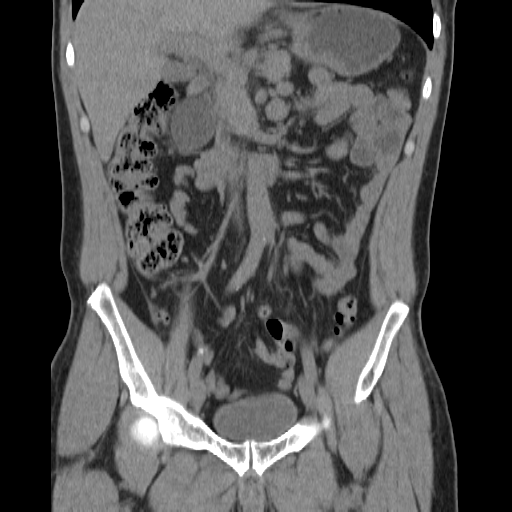
[im 44/79  soft-tissue]
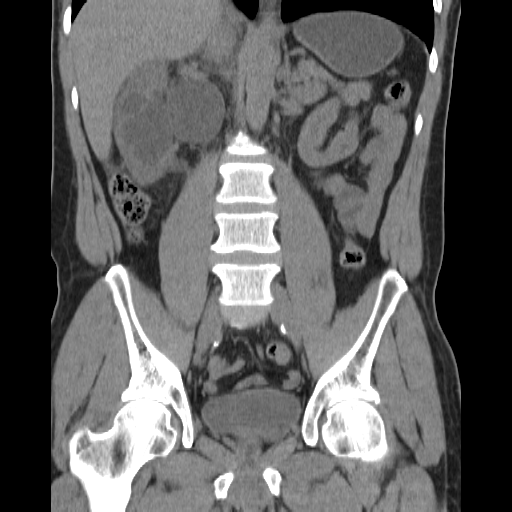

[17 of 46 positions shown; findings below may reference images not displayed]

FINDINGS: Lung bases are essentially clear.

Liver, spleen, pancreas, and adrenal glands are within normal
limits.

Gallbladder is unremarkable.  No intrahepatic or extrahepatic
ductal dilatation.

Left kidney is unremarkable.  Right kidney is notable for severe
hydronephrosis with dilatation of the proximal collecting system
and abrupt narrowing at the UPJ without obstructing calculus.
Surrounding perinephric inflammatory changes.  Mid/distal right
ureter are normal.

No evidence of bowel obstruction.  Normal appendix.

No evidence of abdominal aortic aneurysm.

No abdominopelvic ascites.

No suspicious abdominopelvic lymphadenopathy.

Prostate is unremarkable.

No bladder calculi.  Bladder is within normal limits.

Mild degenerative changes of the visualized thoracolumbar spine.
IMPRESSION: Severe right hydronephrosis with abrupt narrowing at the UPJ,
suggesting a UPJ obstruction.  Surrounding perinephric stranding
raises the possibility of a superimposed inflammatory process or
possibly recent passage of a stone.

No renal, ureteral, or bladder calculi are seen.

Normal appendix.

## 2013-03-21 ENCOUNTER — Other Ambulatory Visit (HOSPITAL_COMMUNITY): Payer: Self-pay | Admitting: Urology

## 2013-03-21 DIAGNOSIS — N135 Crossing vessel and stricture of ureter without hydronephrosis: Secondary | ICD-10-CM

## 2013-03-25 ENCOUNTER — Ambulatory Visit (HOSPITAL_COMMUNITY)
Admission: RE | Admit: 2013-03-25 | Discharge: 2013-03-25 | Disposition: A | Discharge status: Home / Self Care | Payer: Medicare Other | Source: Ambulatory Visit | Attending: Urology | Admitting: Urology

## 2013-03-25 DIAGNOSIS — N1330 Unspecified hydronephrosis: Secondary | ICD-10-CM | POA: Insufficient documentation

## 2013-03-25 DIAGNOSIS — N135 Crossing vessel and stricture of ureter without hydronephrosis: Secondary | ICD-10-CM | POA: Diagnosis not present

## 2013-03-25 MED ORDER — FUROSEMIDE 10 MG/ML IJ SOLN
47.0000 mg | Freq: Once | INTRAMUSCULAR | Status: AC
  Administered 2013-03-25: 47 mg via INTRAVENOUS
  Filled 2013-03-25: qty 6

## 2013-03-25 MED ORDER — TECHNETIUM TC 99M MERTIATIDE
15.7000 | Freq: Once | INTRAVENOUS | Status: AC | PRN
  Administered 2013-03-25: 16 via INTRAVENOUS

## 2013-11-26 ENCOUNTER — Ambulatory Visit: Payer: Self-pay | Admitting: Podiatry

## 2014-03-09 ENCOUNTER — Emergency Department (HOSPITAL_COMMUNITY)
Admission: EM | Admit: 2014-03-09 | Discharge: 2014-03-09 | Disposition: A | Discharge status: Home / Self Care | Payer: Medicare Other | Source: Home / Self Care

## 2014-03-10 ENCOUNTER — Other Ambulatory Visit (HOSPITAL_COMMUNITY): Payer: Self-pay | Admitting: Urology

## 2014-03-10 DIAGNOSIS — Q6239 Other obstructive defects of renal pelvis and ureter: Secondary | ICD-10-CM

## 2014-03-10 DIAGNOSIS — Q6211 Congenital occlusion of ureteropelvic junction: Principal | ICD-10-CM

## 2014-03-18 ENCOUNTER — Ambulatory Visit (HOSPITAL_COMMUNITY): Payer: Medicare Other

## 2014-03-23 ENCOUNTER — Ambulatory Visit (HOSPITAL_COMMUNITY): Admission: RE | Admit: 2014-03-23 | Payer: Medicare Other | Source: Ambulatory Visit

## 2014-04-14 ENCOUNTER — Encounter (HOSPITAL_COMMUNITY): Payer: Self-pay | Admitting: Emergency Medicine

## 2014-04-14 ENCOUNTER — Emergency Department (INDEPENDENT_AMBULATORY_CARE_PROVIDER_SITE_OTHER)
Admission: EM | Admit: 2014-04-14 | Discharge: 2014-04-14 | Disposition: A | Discharge status: Home / Self Care | Payer: Medicare Other | Source: Home / Self Care | Attending: Family Medicine | Admitting: Family Medicine

## 2014-04-14 DIAGNOSIS — Z8614 Personal history of Methicillin resistant Staphylococcus aureus infection: Secondary | ICD-10-CM

## 2014-04-14 DIAGNOSIS — L723 Sebaceous cyst: Secondary | ICD-10-CM

## 2014-04-14 DIAGNOSIS — L089 Local infection of the skin and subcutaneous tissue, unspecified: Secondary | ICD-10-CM

## 2014-04-14 MED ORDER — SULFAMETHOXAZOLE-TRIMETHOPRIM 800-160 MG PO TABS: 1.0000 | ORAL_TABLET | Freq: Two times a day (BID) | ORAL | Status: DC

## 2014-04-14 NOTE — Discharge Instructions (Signed)
You had an infected sebaceous cyst drained today in our clinic. Please start the antibiotic to ensure the infection is gone. This is an extra measure because of history of MRSA infection causing leg dictation. Please start taking ibuprofen 600 mg every 6 hours as needed for pain after he returned home. Please call or return if you have any further questions or competitions.

## 2014-04-14 NOTE — ED Provider Notes (Signed)
CSN: [id number]     Arrival date & time 04/14/14  0915 History   First MD Initiated Contact with Patient 04/14/14 [phone]     Chief Complaint  Patient presents with  . Abscess   (Consider location/radiation/quality/duration/timing/severity/associated sxs/prior Treatment) HPI   L hip boil: started 1 year ago. Intermittent swelling. Popped at home in past w/ improvement. Popped again 3 days ago. Discharge was green. Area is painful - constant. Denies fevers, nausea, CP, SOB, rash, diarrhea. Getting worse.  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    Past Medical History  Diagnosis Date  . 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 GERD (gastroesophageal reflux disease)    Past Surgical History  Procedure Laterality Date  . Ankle surgery  2005 & 2008    left ankle surgery--after fall of 40 feet  . Leg amputation below knee  2009    left below knee amputation -wears prosthesis  . Tur bt  11/21/10  . Robot assisted pyeloplasty  02/02/2011    Procedure: ROBOTIC ASSISTED PYELOPLASTY WITH STENT PLACEMENT;  Surgeon: Dutch Gray, MD;  Location: [facility];  Service: Urology;  Laterality: Right;  Cystoscopy Right Retrograde Right Ureteral Stent    (c-arm)   . Cystoscopy w/ ureteral stent placement  02/02/2011    Procedure: CYSTOSCOPY WITH RETROGRADE PYELOGRAM/URETERAL STENT PLACEMENT;  Surgeon: Dutch Gray, MD;  Location: [facility];  Service: Urology;  Laterality: Right;  Cystoscopy Right Retrograde Right Ureteral Stent    (c-arm)    Family History  Problem Relation Age of Onset  . Diabetes Mother   . Cancer Neg Hx    History  Substance Use Topics  . Smoking status: Current Every Day Smoker -- 0.50 packs/day for 20 years    Types: Cigarettes  . Smokeless tobacco: Not on file  . Alcohol Use: No    Review of Systems  Allergies   Review of patient's allergies indicates no known allergies.  Home Medications   Prior to Admission medications   Medication Sig Start Date End Date Taking? Authorizing Provider  sulfamethoxazole-trimethoprim (BACTRIM DS,SEPTRA DS) 800-160 MG per tablet Take 1 tablet by mouth 2 (two) times daily. 04/14/14   Waldemar Dickens, MD   BP 143/90 mmHg  Pulse 63  Temp(Src) 97.9 F (36.6 C) (Oral)  Resp 12  SpO2 100% Physical Exam  Constitutional: He is oriented to person, place, and time. He appears well-developed and well-nourished.  HENT:  Head: Normocephalic and atraumatic.  Eyes: EOM are normal. Pupils are equal, round, and reactive to light.  Neck: Normal range of motion.  Cardiovascular: Normal rate, normal heart sounds and intact distal pulses.   Pulmonary/Chest: Effort normal and breath sounds normal.  Musculoskeletal: Normal range of motion. He exhibits no edema.  Neurological: He is alert and oriented to person, place, and time.  Skin: Skin is warm.  Left hip with 2 x 2 centimeter area of induration with central sinus and plug. The purulent discharge noted. Tender to palpation.  Psychiatric: He has a normal mood and affect. His behavior is normal. Thought content normal.         ED Course  INCISION AND DRAINAGE Date/Time: 04/14/2014 10:51 AM Performed by: Marily Memos, DAVID J Authorized by: Marily Memos, DAVID J Consent: Verbal consent obtained. Risks and benefits: risks, benefits and alternatives were discussed Consent  given by: patient Patient identity confirmed: verbally with patient Type: abscess Body area: lower extremity Location details: left hip Anesthesia: local infiltration Local anesthetic: lidocaine 2% with epinephrine Anesthetic total: 1.5 ml Scalpel size: 11 Incision type: intersecting straight incisions. Complexity: simple Drainage: purulent Drainage amount: moderate Wound treatment: wound left open Packing material: none Patient tolerance: Patient  tolerated the procedure well with no immediate complications   (including critical care time) Labs Review Labs Reviewed - No data to display  Imaging Review No results found.   MDM   1. Infected sebaceous cyst   2. History of MRSA infection    I&D as above History of MRSA infection resulting in left BKA. Start prophylactic seven-day course of Bactrim. Abscess culture sent. Patient follow-up with PCP in the future for full excision of cyst.  Precautions given and all questions answered   Linna Darner, MD Family Medicine 04/14/2014, 10:54 AM       Waldemar Dickens, MD 04/14/14 1055

## 2014-04-14 NOTE — ED Notes (Signed)
C/o  Abscess on the left upper thigh x 4 to 5 days.  Pt states that he popped it a couple days ago and it has returned and is a little worse.  No relief with warm compresses.

## 2014-04-17 ENCOUNTER — Ambulatory Visit (HOSPITAL_COMMUNITY)
Admission: RE | Admit: 2014-04-17 | Discharge: 2014-04-17 | Disposition: A | Discharge status: Home / Self Care | Payer: Medicare Other | Source: Ambulatory Visit | Attending: Urology | Admitting: Urology

## 2014-04-17 DIAGNOSIS — Q6211 Congenital occlusion of ureteropelvic junction: Secondary | ICD-10-CM

## 2014-04-17 DIAGNOSIS — N1330 Unspecified hydronephrosis: Secondary | ICD-10-CM | POA: Diagnosis not present

## 2014-04-17 DIAGNOSIS — Q6239 Other obstructive defects of renal pelvis and ureter: Secondary | ICD-10-CM | POA: Diagnosis present

## 2014-04-17 LAB — CULTURE, ROUTINE-ABSCESS

## 2014-04-17 MED ORDER — TECHNETIUM TC 99M MERTIATIDE
16.0000 | Freq: Once | INTRAVENOUS | Status: AC | PRN
  Administered 2014-04-17: 16 via INTRAVENOUS

## 2014-04-17 MED ORDER — FUROSEMIDE 10 MG/ML IJ SOLN
46.0000 mg | Freq: Once | INTRAMUSCULAR | Status: AC
  Administered 2014-04-17: 46 mg via INTRAVENOUS
  Filled 2014-04-17: qty 6

## 2014-06-26 DIAGNOSIS — Z1389 Encounter for screening for other disorder: Secondary | ICD-10-CM | POA: Diagnosis not present

## 2014-06-26 DIAGNOSIS — Z0000 Encounter for general adult medical examination without abnormal findings: Secondary | ICD-10-CM | POA: Diagnosis not present

## 2014-06-26 DIAGNOSIS — R030 Elevated blood-pressure reading, without diagnosis of hypertension: Secondary | ICD-10-CM | POA: Diagnosis not present

## 2014-06-26 DIAGNOSIS — Z13220 Encounter for screening for lipoid disorders: Secondary | ICD-10-CM | POA: Diagnosis not present

## 2014-06-26 DIAGNOSIS — K30 Functional dyspepsia: Secondary | ICD-10-CM | POA: Diagnosis not present

## 2014-06-26 DIAGNOSIS — C679 Malignant neoplasm of bladder, unspecified: Secondary | ICD-10-CM | POA: Diagnosis not present

## 2014-06-26 DIAGNOSIS — Z131 Encounter for screening for diabetes mellitus: Secondary | ICD-10-CM | POA: Diagnosis not present

## 2014-11-23 ENCOUNTER — Ambulatory Visit (HOSPITAL_COMMUNITY)
Admission: EM | Admit: 2014-11-23 | Discharge: 2014-11-23 | Disposition: A | Discharge status: Home / Self Care | Payer: Medicare Other | Attending: Emergency Medicine | Admitting: Emergency Medicine

## 2014-11-23 ENCOUNTER — Other Ambulatory Visit: Payer: Self-pay | Admitting: Orthopedic Surgery

## 2014-11-23 ENCOUNTER — Emergency Department (HOSPITAL_COMMUNITY): Payer: Medicare Other

## 2014-11-23 ENCOUNTER — Encounter (HOSPITAL_COMMUNITY): Payer: Self-pay | Admitting: Family Medicine

## 2014-11-23 ENCOUNTER — Emergency Department (HOSPITAL_COMMUNITY): Payer: Medicare Other | Admitting: Anesthesiology

## 2014-11-23 ENCOUNTER — Encounter (HOSPITAL_COMMUNITY)
Admission: EM | Disposition: A | Discharge status: Home / Self Care | Payer: Self-pay | Source: Home / Self Care | Attending: Emergency Medicine

## 2014-11-23 DIAGNOSIS — Y990 Civilian activity done for income or pay: Secondary | ICD-10-CM | POA: Insufficient documentation

## 2014-11-23 DIAGNOSIS — S61312A Laceration without foreign body of right middle finger with damage to nail, initial encounter: Secondary | ICD-10-CM | POA: Diagnosis not present

## 2014-11-23 DIAGNOSIS — Z8614 Personal history of Methicillin resistant Staphylococcus aureus infection: Secondary | ICD-10-CM | POA: Diagnosis not present

## 2014-11-23 DIAGNOSIS — S68112A Complete traumatic metacarpophalangeal amputation of right middle finger, initial encounter: Secondary | ICD-10-CM | POA: Diagnosis not present

## 2014-11-23 DIAGNOSIS — Y9289 Other specified places as the place of occurrence of the external cause: Secondary | ICD-10-CM | POA: Diagnosis not present

## 2014-11-23 DIAGNOSIS — F17210 Nicotine dependence, cigarettes, uncomplicated: Secondary | ICD-10-CM | POA: Diagnosis not present

## 2014-11-23 DIAGNOSIS — S6991XA Unspecified injury of right wrist, hand and finger(s), initial encounter: Secondary | ICD-10-CM | POA: Diagnosis not present

## 2014-11-23 DIAGNOSIS — W278XXA Contact with other nonpowered hand tool, initial encounter: Secondary | ICD-10-CM | POA: Diagnosis not present

## 2014-11-23 DIAGNOSIS — S68122A Partial traumatic metacarpophalangeal amputation of right middle finger, initial encounter: Secondary | ICD-10-CM | POA: Insufficient documentation

## 2014-11-23 DIAGNOSIS — K219 Gastro-esophageal reflux disease without esophagitis: Secondary | ICD-10-CM | POA: Diagnosis not present

## 2014-11-23 DIAGNOSIS — S68119A Complete traumatic metacarpophalangeal amputation of unspecified finger, initial encounter: Secondary | ICD-10-CM

## 2014-11-23 DIAGNOSIS — Y9389 Activity, other specified: Secondary | ICD-10-CM | POA: Insufficient documentation

## 2014-11-23 DIAGNOSIS — S68129A Partial traumatic metacarpophalangeal amputation of unspecified finger, initial encounter: Secondary | ICD-10-CM

## 2014-11-23 HISTORY — PX: I&D EXTREMITY: SHX5045

## 2014-11-23 LAB — CBC WITH DIFFERENTIAL/PLATELET
BASOS PCT: 0 %
Basophils Absolute: 0 10*3/uL (ref 0.0–0.1)
EOS ABS: 0.1 10*3/uL (ref 0.0–0.7)
EOS PCT: 1 %
HCT: 41.6 % (ref 39.0–52.0)
Hemoglobin: 13.8 g/dL (ref 13.0–17.0)
LYMPHS ABS: 2.4 10*3/uL (ref 0.7–4.0)
Lymphocytes Relative: 40 %
MCH: 30.1 pg (ref 26.0–34.0)
MCHC: 33.2 g/dL (ref 30.0–36.0)
MCV: 90.6 fL (ref 78.0–100.0)
MONO ABS: 0.4 10*3/uL (ref 0.1–1.0)
Monocytes Relative: 7 %
NEUTROS PCT: 52 %
Neutro Abs: 3.2 10*3/uL (ref 1.7–7.7)
PLATELETS: ADEQUATE 10*3/uL (ref 150–400)
RBC: 4.59 MIL/uL (ref 4.22–5.81)
RDW: 14.3 % (ref 11.5–15.5)
SMEAR REVIEW: ADEQUATE
WBC: 6.1 10*3/uL (ref 4.0–10.5)

## 2014-11-23 LAB — BASIC METABOLIC PANEL
Anion gap: 9 (ref 5–15)
BUN: 12 mg/dL (ref 6–20)
CALCIUM: 9.3 mg/dL (ref 8.9–10.3)
CO2: 22 mmol/L (ref 22–32)
CREATININE: 0.81 mg/dL (ref 0.61–1.24)
Chloride: 107 mmol/L (ref 101–111)
GFR calc Af Amer: >60 mL/min (ref >60)
GLUCOSE: 98 mg/dL (ref 65–99)
POTASSIUM: 4.9 mmol/L (ref 3.5–5.1)
SODIUM: 138 mmol/L (ref 135–145)

## 2014-11-23 SURGERY — IRRIGATION AND DEBRIDEMENT EXTREMITY
Anesthesia: General | Site: Finger | Laterality: Right

## 2014-11-23 MED ORDER — LIDOCAINE HCL (CARDIAC) 20 MG/ML IV SOLN
INTRAVENOUS | Status: DC | PRN
  Administered 2014-11-23: 100 mg via INTRAVENOUS

## 2014-11-23 MED ORDER — MIDAZOLAM HCL 5 MG/5ML IJ SOLN
INTRAMUSCULAR | Status: DC | PRN
  Administered 2014-11-23: 2 mg via INTRAVENOUS

## 2014-11-23 MED ORDER — MORPHINE SULFATE (PF) 4 MG/ML IV SOLN
4.0000 mg | INTRAVENOUS | Status: DC | PRN
  Administered 2014-11-23: 4 mg via INTRAVENOUS
  Filled 2014-11-23: qty 1

## 2014-11-23 MED ORDER — ONDANSETRON HCL 4 MG/2ML IJ SOLN
INTRAMUSCULAR | Status: DC | PRN
  Administered 2014-11-23: 4 mg via INTRAVENOUS

## 2014-11-23 MED ORDER — BUPIVACAINE HCL (PF) 0.25 % IJ SOLN
INTRAMUSCULAR | Status: DC | PRN
  Administered 2014-11-23: 2 mL

## 2014-11-23 MED ORDER — TETANUS-DIPHTH-ACELL PERTUSSIS 5-2.5-18.5 LF-MCG/0.5 IM SUSP
0.5000 mL | Freq: Once | INTRAMUSCULAR | Status: AC
  Administered 2014-11-23: 0.5 mL via INTRAMUSCULAR
  Filled 2014-11-23: qty 0.5

## 2014-11-23 MED ORDER — FENTANYL CITRATE (PF) 100 MCG/2ML IJ SOLN
INTRAMUSCULAR | Status: DC | PRN
  Administered 2014-11-23: 50 ug via INTRAVENOUS

## 2014-11-23 MED ORDER — EPHEDRINE SULFATE 50 MG/ML IJ SOLN
INTRAMUSCULAR | Status: DC | PRN
  Administered 2014-11-23: 10 mg via INTRAVENOUS

## 2014-11-23 MED ORDER — FENTANYL CITRATE (PF) 250 MCG/5ML IJ SOLN
INTRAMUSCULAR | Status: AC
  Filled 2014-11-23: qty 5

## 2014-11-23 MED ORDER — OXYCODONE-ACETAMINOPHEN 5-325 MG PO TABS: 1.0000 | ORAL_TABLET | ORAL | Status: DC | PRN

## 2014-11-23 MED ORDER — PROPOFOL 10 MG/ML IV BOLUS
INTRAVENOUS | Status: DC | PRN
  Administered 2014-11-23: 200 mg via INTRAVENOUS

## 2014-11-23 MED ORDER — LACTATED RINGERS IV SOLN
INTRAVENOUS | Status: DC | PRN
  Administered 2014-11-23 (×2): via INTRAVENOUS

## 2014-11-23 MED ORDER — ONDANSETRON HCL 4 MG/2ML IJ SOLN
4.0000 mg | Freq: Once | INTRAMUSCULAR | Status: AC
  Administered 2014-11-23: 4 mg via INTRAVENOUS
  Filled 2014-11-23: qty 2

## 2014-11-23 MED ORDER — HYDROMORPHONE HCL 1 MG/ML IJ SOLN: 0.2500 mg | INTRAMUSCULAR | Status: DC | PRN

## 2014-11-23 MED ORDER — MORPHINE SULFATE (PF) 4 MG/ML IV SOLN
4.0000 mg | Freq: Once | INTRAVENOUS | Status: AC
  Administered 2014-11-23: 4 mg via INTRAVENOUS
  Filled 2014-11-23: qty 1

## 2014-11-23 MED ORDER — CEFAZOLIN SODIUM 1-5 GM-% IV SOLN
1.0000 g | Freq: Once | INTRAVENOUS | Status: AC
  Administered 2014-11-23: 1 g via INTRAVENOUS
  Filled 2014-11-23: qty 50

## 2014-11-23 MED ORDER — CEFAZOLIN SODIUM-DEXTROSE 2-3 GM-% IV SOLR
INTRAVENOUS | Status: AC
  Administered 2014-11-23: 2 g via INTRAVENOUS
  Filled 2014-11-23: qty 50

## 2014-11-23 MED ORDER — BUPIVACAINE HCL (PF) 0.25 % IJ SOLN
INTRAMUSCULAR | Status: AC
  Filled 2014-11-23: qty 30

## 2014-11-23 MED ORDER — PROMETHAZINE HCL 25 MG/ML IJ SOLN: 6.2500 mg | INTRAMUSCULAR | Status: DC | PRN

## 2014-11-23 MED ORDER — MIDAZOLAM HCL 2 MG/2ML IJ SOLN
INTRAMUSCULAR | Status: AC
  Filled 2014-11-23: qty 4

## 2014-11-23 SURGICAL SUPPLY — 55 items
BANDAGE ELASTIC 3 VELCRO ST LF (GAUZE/BANDAGES/DRESSINGS) IMPLANT
BANDAGE ELASTIC 4 VELCRO ST LF (GAUZE/BANDAGES/DRESSINGS) ×1 IMPLANT
BNDG CMPR 9X4 STRL LF SNTH (GAUZE/BANDAGES/DRESSINGS) ×1
BNDG COHESIVE 1X5 TAN STRL LF (GAUZE/BANDAGES/DRESSINGS) ×2 IMPLANT
BNDG CONFORM 2 STRL LF (GAUZE/BANDAGES/DRESSINGS) IMPLANT
BNDG ESMARK 4X9 LF (GAUZE/BANDAGES/DRESSINGS) ×2 IMPLANT
BNDG GAUZE ELAST 4 BULKY (GAUZE/BANDAGES/DRESSINGS) ×6 IMPLANT
CANISTER SUCTION 2500CC (MISCELLANEOUS) ×2 IMPLANT
CORDS BIPOLAR (ELECTRODE) ×2 IMPLANT
COVER SURGICAL LIGHT HANDLE (MISCELLANEOUS) ×6 IMPLANT
CUFF TOURNIQUET SINGLE 18IN (TOURNIQUET CUFF) ×3 IMPLANT
DECANTER SPIKE VIAL GLASS SM (MISCELLANEOUS) ×1 IMPLANT
DRAIN PENROSE 1/4X12 LTX STRL (WOUND CARE) ×2 IMPLANT
DRAPE SURG 17X23 STRL (DRAPES) ×3 IMPLANT
DRSG PAD ABDOMINAL 8X10 ST (GAUZE/BANDAGES/DRESSINGS) ×2 IMPLANT
DURAPREP 26ML APPLICATOR (WOUND CARE) IMPLANT
ELECT REM PT RETURN 9FT ADLT (ELECTROSURGICAL)
ELECTRODE REM PT RTRN 9FT ADLT (ELECTROSURGICAL) IMPLANT
GAUZE PACKING IODOFORM 1/4X5 (PACKING) IMPLANT
GAUZE SPONGE 2X2 8PLY STRL LF (GAUZE/BANDAGES/DRESSINGS) IMPLANT
GAUZE SPONGE 4X4 12PLY STRL (GAUZE/BANDAGES/DRESSINGS) ×4 IMPLANT
GAUZE XEROFORM 1X8 LF (GAUZE/BANDAGES/DRESSINGS) ×3 IMPLANT
GLOVE SURG SYN 8.0 (GLOVE) ×3 IMPLANT
GLOVE SURG SYN 8.0 PF PI (GLOVE) ×1 IMPLANT
GOWN STRL REUS W/ TWL LRG LVL3 (GOWN DISPOSABLE) ×1 IMPLANT
GOWN STRL REUS W/ TWL XL LVL3 (GOWN DISPOSABLE) ×1 IMPLANT
GOWN STRL REUS W/TWL LRG LVL3 (GOWN DISPOSABLE) ×3
GOWN STRL REUS W/TWL XL LVL3 (GOWN DISPOSABLE) ×3
HANDPIECE INTERPULSE COAX TIP (DISPOSABLE)
KIT BASIN OR (CUSTOM PROCEDURE TRAY) ×3 IMPLANT
KIT ROOM TURNOVER OR (KITS) ×3 IMPLANT
MANIFOLD NEPTUNE II (INSTRUMENTS) ×1 IMPLANT
NDL HYPO 25X1 1.5 SAFETY (NEEDLE) ×1 IMPLANT
NEEDLE HYPO 25GX1X1/2 BEV (NEEDLE) ×3 IMPLANT
NEEDLE HYPO 25X1 1.5 SAFETY (NEEDLE) IMPLANT
NS IRRIG 1000ML POUR BTL (IV SOLUTION) ×3 IMPLANT
PACK ORTHO EXTREMITY (CUSTOM PROCEDURE TRAY) ×3 IMPLANT
PAD ARMBOARD 7.5X6 YLW CONV (MISCELLANEOUS) ×6 IMPLANT
PAD CAST 4YDX4 CTTN HI CHSV (CAST SUPPLIES) ×2 IMPLANT
PADDING CAST COTTON 4X4 STRL (CAST SUPPLIES)
SET HNDPC FAN SPRY TIP SCT (DISPOSABLE) IMPLANT
SPONGE GAUZE 2X2 STER 10/PKG (GAUZE/BANDAGES/DRESSINGS) ×2
SPONGE LAP 18X18 X RAY DECT (DISPOSABLE) ×3 IMPLANT
SUCTION FRAZIER TIP 10 FR DISP (SUCTIONS) ×3 IMPLANT
SUT VICRYL RAPIDE 4/0 PS 2 (SUTURE) ×2 IMPLANT
SYR 20CC LL (SYRINGE) IMPLANT
SYR CONTROL 10ML LL (SYRINGE) ×3 IMPLANT
TOWEL OR 17X24 6PK STRL BLUE (TOWEL DISPOSABLE) ×3 IMPLANT
TOWEL OR 17X26 10 PK STRL BLUE (TOWEL DISPOSABLE) ×3 IMPLANT
TUBE ANAEROBIC SPECIMEN COL (MISCELLANEOUS) IMPLANT
TUBE CONNECTING 12'X1/4 (SUCTIONS) ×1
TUBE CONNECTING 12X1/4 (SUCTIONS) ×2 IMPLANT
UNDERPAD 30X30 INCONTINENT (UNDERPADS AND DIAPERS) ×3 IMPLANT
WATER STERILE IRR 1000ML POUR (IV SOLUTION) ×1 IMPLANT
YANKAUER SUCT BULB TIP NO VENT (SUCTIONS) ×3 IMPLANT

## 2014-11-23 NOTE — ED Notes (Signed)
p here for right middle finger avulsion. Currently bleeding. Combat gauze placed and hand wrapped.

## 2014-11-23 NOTE — Op Note (Signed)
See note [id number]

## 2014-11-23 NOTE — ED Provider Notes (Signed)
CSN: [id number]     Arrival date & time 11/23/14  1034 History   First MD Initiated Contact with Patient 11/23/14 1138     Chief Complaint  Patient presents with  . Hand Injury     (Consider location/radiation/quality/duration/timing/severity/associated sxs/prior Treatment) HPI Comments: Patient presents to the ER for evaluation of finger injury. Patient reports that he was cutting a board with a circular saw and accidentally put his hand under the board and cut the tip of his right middle finger off. Patient reports constant severe pain. He is unsure when his last tetanus shot was.  Patient is a 54 y.o. male presenting with hand injury.  Hand Injury   Past Medical History  Diagnosis Date  . 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 GERD (gastroesophageal reflux disease)    Past Surgical History  Procedure Laterality Date  . Ankle surgery  2005 & 2008    left ankle surgery--after fall of 40 feet  . Leg amputation below knee  2009    left below knee amputation -wears prosthesis  . Tur bt  11/21/10  . Robot assisted pyeloplasty  02/02/2011    Procedure: ROBOTIC ASSISTED PYELOPLASTY WITH STENT PLACEMENT;  Surgeon: Dutch Gray, MD;  Location: [facility];  Service: Urology;  Laterality: Right;  Cystoscopy Right Retrograde Right Ureteral Stent    (c-arm)   . Cystoscopy w/ ureteral stent placement  02/02/2011    Procedure: CYSTOSCOPY WITH RETROGRADE PYELOGRAM/URETERAL STENT PLACEMENT;  Surgeon: Dutch Gray, MD;  Location: [facility];  Service: Urology;  Laterality: Right;  Cystoscopy Right Retrograde Right Ureteral Stent    (c-arm)    Family History  Problem Relation Age of Onset  . Diabetes Mother   . Cancer Neg Hx    Social History  Substance Use Topics  . Smoking status: Current Every Day Smoker -- 0.50 packs/day for 20 years   Types: Cigarettes  . Smokeless tobacco: None  . Alcohol Use: No    Review of Systems  Skin: Positive for wound.  All other systems reviewed and are negative.     Allergies  Review of patient's allergies indicates no known allergies.  Home Medications   Prior to Admission medications   Medication Sig Start Date End Date Taking? Authorizing Provider  HYDROcodone-acetaminophen (VICODIN) 5-500 MG per tablet Take 1 tablet by mouth every 6 (six) hours as needed for pain.   Yes Historical Provider, MD   BP 148/86 mmHg  Pulse 52  Temp(Src) 98 F (36.7 C) (Oral)  Resp 17  SpO2 97% Physical Exam  Constitutional: He is oriented to person, place, and time. He appears well-developed and well-nourished. No distress.  HENT:  Head: Normocephalic and atraumatic.  Right Ear: Hearing normal.  Left Ear: Hearing normal.  Nose: Nose normal.  Mouth/Throat: Oropharynx is clear and moist and mucous membranes are normal.  Eyes: Conjunctivae and EOM are normal. Pupils are equal, round, and reactive to light.  Neck: Normal range of motion. Neck supple.  Cardiovascular: Regular rhythm, S1 normal and S2 normal.  Exam reveals no gallop and no friction rub.   No murmur heard. Pulmonary/Chest: Effort normal and breath sounds normal. No respiratory distress. He exhibits no tenderness.  Abdominal: Soft. Normal appearance and bowel sounds are normal. There is no hepatosplenomegaly. There is no tenderness. There is no rebound, no  guarding, no tenderness at McBurney's point and negative Murphy's sign. No hernia.  Musculoskeletal: Normal range of motion.       Hands: Neurological: He is alert and oriented to person, place, and time. He has normal strength. No cranial nerve deficit or sensory deficit. Coordination normal. GCS eye subscore is 4. GCS verbal subscore is 5. GCS motor subscore is 6.  Skin: Skin is warm and dry. Laceration noted. No rash noted. No cyanosis.     Psychiatric: He has a normal mood and  affect. His speech is normal and behavior is normal. Thought content normal.  Nursing note and vitals reviewed.         ED Course  Procedures (including critical care time) Labs Review Labs Reviewed  CBC WITH DIFFERENTIAL/PLATELET  BASIC METABOLIC PANEL    Imaging Review Dg Finger Middle Right  11/23/2014   CLINICAL DATA:  Injury/avulsion of the tip of the middle finger.  EXAM: RIGHT MIDDLE FINGER 2+V  COMPARISON:  11/23/2014 at 11:48  a.m.  FINDINGS: There is amputation of the tip of the middle finger including the tuft of the distal phalanx at the level of the base of the tuft. The overlying soft tissues are also absent. The CT appears to be somewhat oblique, from a posterior proximal to anterior-distal orientation.  IMPRESSION: 1. Amputation of the tip of the middle finger, including the tuft of the distal phalanx.   Electronically Signed   By: Van Clines M.D.   On: 11/23/2014 13:41   Dg Finger Middle Right  11/23/2014   CLINICAL DATA:  Saw injury. ""Patient did not want to remove bandage" . Initial encounter.  EXAM: RIGHT MIDDLE FINGER 2+V  COMPARISON:  None.  FINDINGS: Severely degraded exam, secondary to overlying artifact. Fractures involving the distal portion of the middle phalanx or distal phalanx cannot be excluded. No fracture of the proximal phalanx or distal metacarpal is seen.  IMPRESSION: Severely degraded, essentially nondiagnostic exam secondary to overlying bandage artifact.   Electronically Signed   By: Abigail Miyamoto M.D.   On: 11/23/2014 12:22   I have personally reviewed and evaluated these images and lab results as part of my medical decision-making.   EKG Interpretation None      MDM   Final diagnoses:  Fingertip amputation, initial encounter    Patient presents to the ER for evaluation of finger tip injury. Patient accidentally cut the tip of his right middle finger while working with circular saw. Examination revealed avulsion of the majority of  the tip of the middle finger. Patient's fingernail and nail bed is avulsed. X-ray confirms complete avulsion of the tuft of the phalanx as well. Patient administered morphine for pain. He was updated with tetanus posterior. He was administered IV Ancef. Dr. Burney Gauze will see the patient in the ER.    Orpah Greek, MD 11/23/14 1350

## 2014-11-23 NOTE — Anesthesia Postprocedure Evaluation (Signed)
  Anesthesia Post-op Note  Patient: Michael Morrison  Procedure(s) Performed: Procedure(s): VY FLAP TO RIGHT MIDDLE FINGER (Right)  Patient Location: PACU  Anesthesia Type:General  Level of Consciousness: awake, alert  and oriented  Airway and Oxygen Therapy: Patient Spontanous Breathing and Patient connected to nasal cannula oxygen  Post-op Pain: none  Post-op Assessment: Post-op Vital signs reviewed, Patient's Cardiovascular Status Stable, Respiratory Function Stable and Patent Airway              Post-op Vital Signs: stable  Last Vitals:  Filed Vitals:   11/23/14 1806  BP: 162/79  Pulse: 47  Temp: 36.5 C  Resp: 12    Complications: No apparent anesthesia complications

## 2014-11-23 NOTE — Anesthesia Preprocedure Evaluation (Addendum)
Anesthesia Evaluation  Patient identified by MRN, date of birth, ID band Patient awake    Reviewed: Allergy & Precautions, NPO status , Patient's Chart, lab work & pertinent test results  History of Anesthesia Complications Negative for: history of anesthetic complications  Airway Mallampati: I  TM Distance: >3 FB Neck ROM: Full    Dental  (+) Partial Upper   Pulmonary Current Smoker,    breath sounds clear to auscultation       Cardiovascular negative cardio ROS   Rhythm:Regular Rate:Normal     Neuro/Psych    GI/Hepatic Neg liver ROS, GERD  ,  Endo/Other  negative endocrine ROS  Renal/GU negative Renal ROS     Musculoskeletal   Abdominal   Peds  Hematology negative hematology ROS (+)   Anesthesia Other Findings   Reproductive/Obstetrics                           Anesthesia Physical Anesthesia Plan  ASA: II  Anesthesia Plan: General   Post-op Pain Management:    Induction: Intravenous  Airway Management Planned:   Additional Equipment:   Intra-op Plan:   Post-operative Plan: Extubation in OR  Informed Consent: I have reviewed the patients History and Physical, chart, labs and discussed the procedure including the risks, benefits and alternatives for the proposed anesthesia with the patient or authorized representative who has indicated his/her understanding and acceptance.     Plan Discussed with:   Anesthesia Plan Comments:         Anesthesia Quick Evaluation

## 2014-11-23 NOTE — ED Notes (Signed)
MD made aware actively bleeding. Vaseline gauze placed , 4x4 and Kerlex

## 2014-11-23 NOTE — H&P (Signed)
Reason for Consult:right long finger Referring Physician: [name] is an 54 y.o. male.  HPI: s/p work injury with saw  Past Medical History  Diagnosis Date  . 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 GERD (gastroesophageal reflux disease)     Past Surgical History  Procedure Laterality Date  . Ankle surgery  2005 & 2008    left ankle surgery--after fall of 40 feet  . Leg amputation below knee  2009    left below knee amputation -wears prosthesis  . Tur bt  11/21/10  . Robot assisted pyeloplasty  02/02/2011    Procedure: ROBOTIC ASSISTED PYELOPLASTY WITH STENT PLACEMENT;  Surgeon: Dutch Gray, MD;  Location: [facility];  Service: Urology;  Laterality: Right;  Cystoscopy Right Retrograde Right Ureteral Stent    (c-arm)   . Cystoscopy w/ ureteral stent placement  02/02/2011    Procedure: CYSTOSCOPY WITH RETROGRADE PYELOGRAM/URETERAL STENT PLACEMENT;  Surgeon: Dutch Gray, MD;  Location: [facility];  Service: Urology;  Laterality: Right;  Cystoscopy Right Retrograde Right Ureteral Stent    (c-arm)     Family History  Problem Relation Age of Onset  . Diabetes Mother   . Cancer Neg Hx     Social History:  reports that he has been smoking Cigarettes.  He has a 10 pack-year smoking history. He does not have any smokeless tobacco history on file. He reports that he does not drink alcohol or use illicit drugs.  Allergies: No Known Allergies  Medications:  Prior to Admission:  Prescriptions prior to admission  Medication Sig Dispense Refill Last Dose  . HYDROcodone-acetaminophen (VICODIN) 5-500 MG per tablet Take 1 tablet by mouth every 6 (six) hours as needed for pain.   Past Week at Unknown time   Scheduled:   Results for orders placed or performed during the hospital encounter of 11/23/14 (from the past 48 hour(s))   CBC with Differential/Platelet     Status: None   Collection Time: 11/23/14 12:30 PM  Result Value Ref Range   WBC 6.1 4.0 - 10.5 K/uL    Comment: WHITE COUNT CONFIRMED ON SMEAR   RBC 4.59 4.22 - 5.81 MIL/uL   Hemoglobin 13.8 13.0 - 17.0 g/dL   HCT 41.6 39.0 - 52.0 %   MCV 90.6 78.0 - 100.0 fL   MCH 30.1 26.0 - 34.0 pg   MCHC 33.2 30.0 - 36.0 g/dL   RDW 14.3 11.5 - 15.5 %   Platelets PLATELETS APPEAR ADEQUATE 150 - 400 K/uL   Neutrophils Relative % 52 %   Lymphocytes Relative 40 %   Monocytes Relative 7 %   Eosinophils Relative 1 %   Basophils Relative 0 %   Neutro Abs 3.2 1.7 - 7.7 K/uL   Lymphs Abs 2.4 0.7 - 4.0 K/uL   Monocytes Absolute 0.4 0.1 - 1.0 K/uL   Eosinophils Absolute 0.1 0.0 - 0.7 K/uL   Basophils Absolute 0.0 0.0 - 0.1 K/uL   Smear Review      PLATELET CLUMPS NOTED ON SMEAR, COUNT APPEARS ADEQUATE  Basic metabolic panel     Status: None   Collection Time: 11/23/14 12:30 PM  Result Value Ref Range   Sodium 138 135 - 145 mmol/L   Potassium 4.9 3.5 - 5.1 mmol/L    Comment: SPECIMEN HEMOLYZED. HEMOLYSIS MAY AFFECT  INTEGRITY OF RESULTS.   Chloride 107 101 - 111 mmol/L   CO2 22 22 - 32 mmol/L   Glucose, Bld 98 65 - 99 mg/dL   BUN 12 6 - 20 mg/dL   Creatinine, Ser 0.81 0.61 - 1.24 mg/dL   Calcium 9.3 8.9 - 10.3 mg/dL   GFR calc non Af Amer >60 >60 mL/min   GFR calc Af Amer >60 >60 mL/min    Comment: (NOTE) The eGFR has been calculated using the CKD EPI equation. This calculation has not been validated in all clinical situations. eGFR's persistently <60 mL/min signify possible Chronic Kidney Disease.    Anion gap 9 5 - 15    Dg Finger Middle Right  11/23/2014   CLINICAL DATA:  Injury/avulsion of the tip of the middle finger.  EXAM: RIGHT MIDDLE FINGER 2+V  COMPARISON:  11/23/2014 at 11:48  a.m.  FINDINGS: There is amputation of the tip of the middle finger including the tuft of the distal phalanx at the level of the base of the tuft. The overlying soft  tissues are also absent. The CT appears to be somewhat oblique, from a posterior proximal to anterior-distal orientation.  IMPRESSION: 1. Amputation of the tip of the middle finger, including the tuft of the distal phalanx.   Electronically Signed   By: Van Clines M.D.   On: 11/23/2014 13:41   Dg Finger Middle Right  11/23/2014   CLINICAL DATA:  Saw injury. ""Patient did not want to remove bandage" . Initial encounter.  EXAM: RIGHT MIDDLE FINGER 2+V  COMPARISON:  None.  FINDINGS: Severely degraded exam, secondary to overlying artifact. Fractures involving the distal portion of the middle phalanx or distal phalanx cannot be excluded. No fracture of the proximal phalanx or distal metacarpal is seen.  IMPRESSION: Severely degraded, essentially nondiagnostic exam secondary to overlying bandage artifact.   Electronically Signed   By: Abigail Miyamoto M.D.   On: 11/23/2014 12:22    Review of Systems  All other systems reviewed and are negative.  Blood pressure 133/77, pulse 51, temperature 98 F (36.7 C), temperature source Oral, resp. rate 16, SpO2 98 %. Physical Exam  Constitutional: He is oriented to person, place, and time. He appears well-developed and well-nourished.  HENT:  Head: Normocephalic and atraumatic.  Cardiovascular: Normal rate.   Respiratory: Effort normal.  Musculoskeletal: Normal range of motion.       Right hand: He exhibits tenderness, bony tenderness, deformity and laceration.  Right long finger traumatic p-2 level amputation  Neurological: He is alert and oriented to person, place, and time.  Skin: Skin is warm.  Psychiatric: He has a normal mood and affect. His behavior is normal. Judgment and thought content normal.    Assessment/Plan: As above  Plan revision amputation  [name],[name] 11/23/2014, 5:02 PM

## 2014-11-23 NOTE — Transfer of Care (Signed)
Immediate Anesthesia Transfer of Care Note  Patient: Michael Morrison  Procedure(s) Performed: Procedure(s): VY FLAP TO RIGHT MIDDLE FINGER (Right)  Patient Location: PACU  Anesthesia Type:General  Level of Consciousness: awake, alert , oriented and patient cooperative  Airway & Oxygen Therapy: Patient Spontanous Breathing and Patient connected to nasal cannula oxygen  Post-op Assessment: Report given to RN and Post -op Vital signs reviewed and stable  Post vital signs: Reviewed and stable  Last Vitals:  Filed Vitals:   11/23/14 1515  BP:   Pulse: 51  Temp:   Resp:     Complications: No apparent anesthesia complications

## 2014-11-24 ENCOUNTER — Encounter (HOSPITAL_COMMUNITY): Payer: Self-pay | Admitting: Orthopedic Surgery

## 2014-11-24 NOTE — Op Note (Signed)
NAMEWYNDHAM, SANTILLI NO.:  [id number]  MEDICAL RECORD NO.:  [id number]  LOCATION:  [facility]                         FACILITY:  [facility]  PHYSICIAN:  Sheral Apley. [name], M.D.DATE OF BIRTH:  08/23/1960  DATE OF PROCEDURE:  11/23/2014 DATE OF DISCHARGE:  11/23/2014                              OPERATIVE REPORT   PREOPERATIVE DIAGNOSIS:  Right long finger tip amputation.  POSTOPERATIVE DIAGNOSIS:  Right long finger tip amputation.  PROCEDURE:  Incision and drainage above the volar V-Y flap.  SURGEON:  Sheral Apley. Burney Gauze, M.D.  ASSISTANT:  None.  ANESTHESIA:  General.  COMPLICATIONS:  No complication.  DRAINS:  No drains.  DESCRIPTION OF PROCEDURE:  The patient was taken to the operating suite. After induction of adequate general anesthetic.  Right upper extremity was prepped and draped in sterile fashion.  An Esmarch was used to exsanguinate the limb.  Tourniquet was inflated to 250 mmHg.  At this point in time, the right long finger was approached surgically.  There was complete avulsion of the nail plate from the underlying nail bed and the tip amputation through the distal two-thirds of the distal phalanx. We debrided a wound of nonviable tissues using rongeur, there is a little skeletal shortening tied up at the tip of the bone and then advanced the flap volarly over the top and sutured it to the dorsal aspect along the lateral side in the eponychial fold using 4-0 Vicryl Rapide.  The germinal matrix and __________ sterile matrix were retained.  The wound was then dressed with Xeroform, 4x4s, and Coban wrap.  The patient tolerated the procedure well in concealed fashion.     Sheral Apley Burney Gauze, M.D.     MAW/MEDQ  D:  11/23/2014  T:  11/24/2014  Job:  [id number]

## 2014-11-26 DIAGNOSIS — S61312A Laceration without foreign body of right middle finger with damage to nail, initial encounter: Secondary | ICD-10-CM | POA: Diagnosis not present

## 2014-11-26 DIAGNOSIS — S61112D Laceration without foreign body of left thumb with damage to nail, subsequent encounter: Secondary | ICD-10-CM | POA: Diagnosis not present

## 2014-12-02 NOTE — Addendum Note (Signed)
Addendum  created 12/02/14 7902 by Roberts Gaudy, MD   Modules edited: Anesthesia Responsible Staff

## 2015-07-14 DIAGNOSIS — Z0000 Encounter for general adult medical examination without abnormal findings: Secondary | ICD-10-CM | POA: Diagnosis not present

## 2015-07-14 DIAGNOSIS — C678 Malignant neoplasm of overlapping sites of bladder: Secondary | ICD-10-CM | POA: Diagnosis not present

## 2015-08-20 DIAGNOSIS — N50811 Right testicular pain: Secondary | ICD-10-CM | POA: Diagnosis not present

## 2015-08-25 DIAGNOSIS — K625 Hemorrhage of anus and rectum: Secondary | ICD-10-CM | POA: Diagnosis not present

## 2015-08-25 DIAGNOSIS — K219 Gastro-esophageal reflux disease without esophagitis: Secondary | ICD-10-CM | POA: Diagnosis not present

## 2015-08-25 DIAGNOSIS — Z1211 Encounter for screening for malignant neoplasm of colon: Secondary | ICD-10-CM | POA: Diagnosis not present

## 2017-03-12 ENCOUNTER — Ambulatory Visit: Payer: Self-pay | Admitting: Podiatry

## 2017-03-23 ENCOUNTER — Ambulatory Visit: Payer: Self-pay | Admitting: Podiatry

## 2017-05-01 ENCOUNTER — Ambulatory Visit: Payer: Self-pay | Admitting: Internal Medicine

## 2017-05-02 ENCOUNTER — Ambulatory Visit (INDEPENDENT_AMBULATORY_CARE_PROVIDER_SITE_OTHER): Payer: 59 | Admitting: Podiatry

## 2017-05-02 DIAGNOSIS — M79671 Pain in right foot: Secondary | ICD-10-CM | POA: Diagnosis not present

## 2017-05-02 DIAGNOSIS — Q828 Other specified congenital malformations of skin: Secondary | ICD-10-CM

## 2017-05-02 DIAGNOSIS — Z89512 Acquired absence of left leg below knee: Secondary | ICD-10-CM | POA: Diagnosis not present

## 2017-05-02 NOTE — Progress Notes (Signed)
Subjective:    Patient ID: Michael Morrison, male    DOB: 11-06-1960, 57 y.o.   MRN: [id number]  HPI  57 year old male presents the office today for concerns of pain to the outside aspect of his right foot, callus.  He states that as he gets really think it gets painful.  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  He denies any recent injury or trauma denies any swelling or redness or any drainage.  He said no recent treatment for this.  He does try soak in Epsom salts however.  Denies any drainage or pus or any swelling or redness.  He has an amputation due to an injury in the left leg which resulted in infection.    Review of Systems  All other systems reviewed and are negative.  Past Medical History:  Diagnosis Date  . 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 GERD (gastroesophageal reflux disease)   . Ureteral obstruction    right ureteropelvic junction obstruction    Past Surgical History:  Procedure Laterality Date  . ANKLE SURGERY  2005 & 2008   left ankle surgery--after fall of 40 feet  . CYSTOSCOPY W/ URETERAL STENT PLACEMENT  02/02/2011   Procedure: CYSTOSCOPY WITH RETROGRADE PYELOGRAM/URETERAL STENT PLACEMENT;  Surgeon: Dutch Gray, MD;  Location: [facility];  Service: Urology;  Laterality: Right;  Cystoscopy Right Retrograde Right Ureteral Stent    (c-arm)   . I&D EXTREMITY Right 11/23/2014   Procedure: VY FLAP TO RIGHT MIDDLE FINGER;  Surgeon: Charlotte Crumb, MD;  Location: [place];  Service: Orthopedics;  Laterality: Right;  . LEG AMPUTATION BELOW KNEE  2009   left below knee amputation -wears prosthesis  . ROBOT ASSISTED PYELOPLASTY  02/02/2011   Procedure: ROBOTIC ASSISTED PYELOPLASTY WITH STENT PLACEMENT;  Surgeon: Dutch Gray, MD;  Location: [facility];  Service: Urology;  Laterality: Right;  Cystoscopy Right Retrograde Right  Ureteral Stent    (c-arm)   . tur bt  11/21/10     Current Outpatient Medications:  .  HYDROcodone-acetaminophen (VICODIN) 5-500 MG per tablet, Take 1 tablet by mouth every 6 (six) hours as needed for pain., Disp: , Rfl:  .  oxyCODONE-acetaminophen (ROXICET) 5-325 MG per tablet, Take 1 tablet by mouth every 4 (four) hours as needed for severe pain., Disp: 30 tablet, Rfl: 0  No Known Allergies  Social History   Socioeconomic History  . Marital status: Divorced    Spouse name: Not on file  . Number of children: Not on file  . Years of education: Not on file  . Highest education level: Not on file  Social Needs  . Financial resource strain: Not on file  . Food insecurity - worry: Not on file  . Food insecurity - inability: Not on file  . Transportation needs - medical: Not on file  . Transportation needs - non-medical: Not on file  Occupational History  . Not on file  Tobacco Use  . Smoking status: Current Every Day Smoker    Packs/day: 0.50    Years: 20.00    Pack years: 10.00    Types: Cigarettes  Substance and Sexual Activity  . Alcohol use: No  . Drug use: No  . Sexual activity: Not on file  Other Topics Concern  . Not on file  Social History  Narrative  . Not on file        Objective:   Physical Exam General: AAO x3, NAD  Dermatological: Hyperkeratotic lesion right foot fifth metatarsal base.  Upon debridement there is no underlying ulceration, drainage or any signs of infection but this is a thick hyperkeratotic lesion.  There is no other open lesions or pre-ulcerative lesions identified today.  Vascular: Dorsalis Pedis artery and Posterior Tibial artery pedal pulses are 2/4 bilateral with immedate capillary fill time. There is no pain with calf compression, swelling, warmth, erythema.   Neruologic: Grossly intact via light touch bilateral.  Protective threshold with Semmes Wienstein monofilament intact to all pedal sites bilateral.   Musculoskeletal:  Below-knee amputation left leg muscular strength 5/5 in all groups tested bilateral.  Gait: Unassisted, Nonantalgic.      Assessment & Plan:  57 year old male presents for right fifth metatarsal base hyperkeratotic lesion -Treatment options discussed including all alternatives, risks, and complications -Etiology of symptoms were discussed -Declined x-ray -After verbal consent I cleaned the area with alcohol I sharply debrided the lesion with a #312 blade scalpel without any complications or bleeding.  Offloading pads were dispensed.  Discussed with him long-term he may benefit from orthotic to help offload the area.  Check orthotic coverage and have him come in to see Liliane Channel or Benjie Karvonen to get molded for the inserts to offload the area.  He is to be helpful given the below knee amputation of the left side to help balance the right side.  He wears a small lift in her shoe on the right side.  Trula Slade DPM

## 2017-05-14 ENCOUNTER — Other Ambulatory Visit: Payer: Medicare Other | Admitting: Orthotics

## 2017-05-15 ENCOUNTER — Ambulatory Visit: Payer: Self-pay | Admitting: Internal Medicine

## 2017-06-26 ENCOUNTER — Ambulatory Visit (INDEPENDENT_AMBULATORY_CARE_PROVIDER_SITE_OTHER): Payer: Medicare Other | Admitting: Orthopedic Surgery

## 2017-07-11 ENCOUNTER — Ambulatory Visit (INDEPENDENT_AMBULATORY_CARE_PROVIDER_SITE_OTHER): Payer: Medicare Other | Admitting: Orthopedic Surgery

## 2017-08-06 ENCOUNTER — Ambulatory Visit (INDEPENDENT_AMBULATORY_CARE_PROVIDER_SITE_OTHER): Payer: Medicare Other | Admitting: Orthopedic Surgery

## 2017-08-13 ENCOUNTER — Ambulatory Visit (INDEPENDENT_AMBULATORY_CARE_PROVIDER_SITE_OTHER): Payer: Medicare Other | Admitting: Orthopedic Surgery

## 2017-08-24 ENCOUNTER — Encounter (INDEPENDENT_AMBULATORY_CARE_PROVIDER_SITE_OTHER): Payer: Self-pay | Admitting: Orthopedic Surgery

## 2017-08-24 ENCOUNTER — Ambulatory Visit (INDEPENDENT_AMBULATORY_CARE_PROVIDER_SITE_OTHER): Payer: Self-pay

## 2017-08-24 ENCOUNTER — Ambulatory Visit (INDEPENDENT_AMBULATORY_CARE_PROVIDER_SITE_OTHER): Payer: Medicare Other | Admitting: Orthopedic Surgery

## 2017-08-24 VITALS — Ht 72.0 in | Wt 203.0 lb

## 2017-08-24 DIAGNOSIS — M25511 Pain in right shoulder: Secondary | ICD-10-CM | POA: Diagnosis not present

## 2017-08-24 DIAGNOSIS — Z89512 Acquired absence of left leg below knee: Secondary | ICD-10-CM

## 2017-08-24 DIAGNOSIS — G8929 Other chronic pain: Secondary | ICD-10-CM | POA: Diagnosis not present

## 2017-08-24 DIAGNOSIS — M7541 Impingement syndrome of right shoulder: Secondary | ICD-10-CM

## 2017-08-24 NOTE — Progress Notes (Signed)
Office Visit Note   Patient: Michael Morrison           Date of Birth: 07/26/1960           MRN: [id number] Visit Date: 08/24/2017              Requested by: No referring provider defined for this encounter. PCP: Patient, No Pcp Per  Chief Complaint  Patient presents with  . Left Leg - Follow-up    BKA working with [employer] needs new prosthetic   . Right Shoulder - Pain      HPI: Patient is a 57 year old gentleman who presents with a broken down prosthesis on the left with broken foot ankle leg and socket.  Patient is a K3 level ambulator he has to work as a Tax inspector.  Patient complains of shoulder pain with painting.  Assessment & Plan: Visit Diagnoses:  1. Chronic right shoulder pain   2. Impingement syndrome of right shoulder   3. History of left below knee amputation ([place])     Plan: Patient was given a prescription for [employer] for a new K3 level prosthesis on the left.  No intervention for the right shoulder at this time if his symptoms worsen we could proceed with a subacromial injection.  Follow-Up Instructions: Return if symptoms worsen or fail to improve.   Ortho Exam  Patient is alert, oriented, no adenopathy, well-dressed, normal affect, normal respiratory effort. Examination of right shoulder he does have arthropathy of the [facility] joint he has full range of motion of the right shoulder no instability no pain with Neer or Hawkins impingement test.  Examination of the left transtibial amputation the residual limb is well consolidated the socket is broken the foot ankle leg are also broken and nonfunctional.  Patient has no rotational stability.  Imaging: Xr Shoulder Right  Result Date: 08/24/2017 2 view radiographs of the right shoulder shows arthropathy of the [facility] joint with a congruent glenohumeral joint.  No images are attached to the encounter.  Labs: Lab Results  Component Value Date   ESRSEDRATE 87 (H) 05/22/2007   ESRSEDRATE 52 (H) 05/20/2007   LABURIC 4.1 05/20/2007   REPTSTATUS 04/17/2014 FINAL 04/14/2014   GRAMSTAIN  04/14/2014    MODERATE WBC PRESENT,BOTH PMN AND MONONUCLEAR NO SQUAMOUS EPITHELIAL CELLS SEEN FEW GRAM POSITIVE RODS RARE GRAM POSITIVE COCCI IN PAIRS    CULT  04/14/2014    MULTIPLE ORGANISMS PRESENT, NONE PREDOMINANT Note: NO STAPHYLOCOCCUS AUREUS ISOLATED NO GROUP A STREP (S.PYOGENES) ISOLATED Performed at [facility] 05/24/2007     Lab Results  Component Value Date   ALBUMIN 4.0 11/23/2010   ALBUMIN 3.8 10/25/2010   ALBUMIN 2.8 (L) 05/23/2007   LABURIC 4.1 05/20/2007    Body mass index is 27.53 kg/m.  Orders:  Orders Placed This Encounter  Procedures  . XR Shoulder Right   No orders of the defined types were placed in this encounter.    Procedures: No procedures performed  Clinical Data: No additional findings.  ROS:  All other systems negative, except as noted in the HPI. Review of Systems  Objective: Vital Signs: Ht 6' (1.829 m)   Wt 203 lb (92.1 kg)   BMI 27.53 kg/m   Specialty Comments:  No specialty comments available.  PMFS History: Patient Active Problem List   Diagnosis Date Noted  . METHICILLIN RESISTANT STAPHYLOCOCCUS AUREUS INFECTION 07/03/2007  . CHRONIC OSTEOMYELITIS ANKLE  AND FOOT 07/03/2007  . BKA, LEFT LEG 07/03/2007   Past Medical History:  Diagnosis Date  . Amputation of leg below knee, left, traumatic, complicated (HCC) t   HX OF MRSA INFECTION LEFT ANKLE AFTER INJURY AND MULTIPLE SURGERIES  . GERD (gastroesophageal reflux disease)   . Ureteral obstruction    right ureteropelvic junction obstruction    Family History  Problem Relation Age of Onset  . Diabetes Mother   . Cancer Neg Hx     Past Surgical History:  Procedure Laterality Date  . ANKLE SURGERY  2005 & 2008   left ankle surgery--after fall of 40 feet  . CYSTOSCOPY W/ URETERAL STENT  PLACEMENT  02/02/2011   Procedure: CYSTOSCOPY WITH RETROGRADE PYELOGRAM/URETERAL STENT PLACEMENT;  Surgeon: Dutch Gray, MD;  [facility];  Service: Urology;  Laterality: Right;  Cystoscopy Right Retrograde Right Ureteral Stent    (c-arm)   . I&D EXTREMITY Right 11/23/2014   Procedure: VY FLAP TO RIGHT MIDDLE FINGER;  Surgeon: Charlotte Crumb, MD;  Location: [place];  Service: Orthopedics;  Laterality: Right;  . LEG AMPUTATION BELOW KNEE  2009   left below knee amputation -wears prosthesis  . ROBOT ASSISTED PYELOPLASTY  02/02/2011   Procedure: ROBOTIC ASSISTED PYELOPLASTY WITH STENT PLACEMENT;  Surgeon: Dutch Gray, MD;  [facility];  Service: Urology;  Laterality: Right;  Cystoscopy Right Retrograde Right Ureteral Stent    (c-arm)   . tur bt  11/21/10   Social History   Occupational History  . Not on file  Tobacco Use  . Smoking status: Current Every Day Smoker    Packs/day: 0.50    Years: 20.00    Pack years: 10.00    Types: Cigarettes  . Smokeless tobacco: Never Used  Substance and Sexual Activity  . Alcohol use: No  . Drug use: No  . Sexual activity: Not on file

## 2018-03-05 ENCOUNTER — Other Ambulatory Visit: Payer: Self-pay | Admitting: Family Medicine

## 2018-03-05 ENCOUNTER — Ambulatory Visit (INDEPENDENT_AMBULATORY_CARE_PROVIDER_SITE_OTHER): Payer: 59 | Admitting: Family Medicine

## 2018-03-05 ENCOUNTER — Encounter: Payer: Self-pay | Admitting: Family Medicine

## 2018-03-05 VITALS — BP 125/70 | HR 68 | Temp 98.3°F | Resp 12 | Ht 72.0 in | Wt 201.5 lb

## 2018-03-05 DIAGNOSIS — M7711 Lateral epicondylitis, right elbow: Secondary | ICD-10-CM | POA: Diagnosis not present

## 2018-03-05 DIAGNOSIS — K219 Gastro-esophageal reflux disease without esophagitis: Secondary | ICD-10-CM

## 2018-03-05 DIAGNOSIS — R1310 Dysphagia, unspecified: Secondary | ICD-10-CM

## 2018-03-05 DIAGNOSIS — L02415 Cutaneous abscess of right lower limb: Secondary | ICD-10-CM | POA: Diagnosis not present

## 2018-03-05 MED ORDER — SULFAMETHOXAZOLE-TRIMETHOPRIM 800-160 MG PO TABS: 1.0000 | ORAL_TABLET | Freq: Two times a day (BID) | ORAL | 0 refills | Status: DC

## 2018-03-05 MED ORDER — OMEPRAZOLE 40 MG PO CPDR: 40.0000 mg | DELAYED_RELEASE_CAPSULE | Freq: Every day | ORAL | 1 refills | Status: DC

## 2018-03-05 NOTE — Progress Notes (Signed)
HPI:   Michael Morrison is a 58 y.o. male, who is here today with his wife to establish care.  Former PCP: Dr Alinda Money. Last preventive routine visit: About 3 years ago.  Chronic medical problems: GERD, s/p BKA LLE after complicated fracture in 2836.   Concerns today:   -Right leg "knot." Noted a "pimple" about 3-4 weeks ago. He tried to drain lesion with a knife. Soreness. Denies fever,chills,or myalgias.  It seems better.   -Since 07/2017 he is getting "shock with food", mainly with meat. It has been stable. He has not identified alleviating factors.  + Heartburn. Hx of GERD, he has taken his wife's Omeprazole for the past couple days and seems to be better.  Denies abdominal pain, nausea, vomiting, changes in bowel habits, blood in stool or melena.  + Smoker.   -He is also c/o right elbow pain that started after hitting elbow against a pipe while he was helping to move furniture. Pain is better today. Constant ,mild achy pain. Exacerbated by palpation and certain activities,mainly overhead. Denies numbness,tingling,or neck pain.  Negative for edema,erythema,or limitation of ROM. He has not taken OTC analgesics.   Review of Systems  Constitutional: Negative for activity change, appetite change, fatigue and fever.  HENT: Positive for trouble swallowing. Negative for nosebleeds, sore throat and voice change.   Eyes: Negative for redness and visual disturbance.  Respiratory: Negative for cough, shortness of breath and wheezing.   Cardiovascular: Negative for chest pain, palpitations and leg swelling.  Gastrointestinal: Negative for abdominal pain, blood in stool, nausea and vomiting.  Genitourinary: Negative for decreased urine volume, dysuria and hematuria.  Musculoskeletal: Positive for arthralgias. Negative for neck pain.  Skin: Negative for pallor and rash.  Neurological: Negative for weakness, numbness and headaches.      Current Outpatient  Medications on File Prior to Visit  Medication Sig Dispense Refill  . DICLOFENAC SODIUM PO Take 75 mg by mouth 2 (two) times daily.     No current facility-administered medications on file prior to visit.      Past Medical History:  Diagnosis Date  . 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 GERD (gastroesophageal reflux disease)   . Ureteral obstruction    right ureteropelvic junction obstruction   No Known Allergies  Family History  Problem Relation Age of Onset  . Diabetes Mother   . Arthritis Mother   . Cancer Mother   . Hyperlipidemia Mother   . Hypertension Mother   . Arthritis Sister   . Diabetes Sister   . Arthritis Brother   . Cancer Brother   . Diabetes Maternal Grandmother   . Diabetes Brother   . Drug abuse Brother     Social History   Socioeconomic History  . Marital status: Divorced    Spouse name: Not on file  . Number of children: Not on file  . Years of education: Not on file  . Highest education level: Not on file  Occupational History  . Not on file  Social Needs  . Financial resource strain: Not on file  . Food insecurity:    Worry: Not on file    Inability: Not on file  . Transportation needs:    Medical: Not on file    Non-medical: Not on file  Tobacco Use  . Smoking status: Current Every Day Smoker    Packs/day: 0.50  Years: 20.00    Pack years: 10.00    Types: Cigarettes  . Smokeless tobacco: Never Used  Substance and Sexual Activity  . Alcohol use: Yes  . Drug use: No  . Sexual activity: Not on file  Lifestyle  . Physical activity:    Days per week: Not on file    Minutes per session: Not on file  . Stress: Not on file  Relationships  . Social connections:    Talks on phone: Not on file    Gets together: Not on file    Attends religious service: Not on file    Active member of club or organization: Not on file    Attends  meetings of clubs or organizations: Not on file    Relationship status: Not on file  Other Topics Concern  . Not on file  Social History Narrative  . Not on file    Vitals:   03/05/18 1358  BP: 125/70  Pulse: 68  Resp: 12  Temp: 98.3 F (36.8 C)  SpO2: 99%    Body mass index is 27.33 kg/m.   Physical Exam  Nursing note reviewed. Constitutional: He is oriented to person, place, and time. He appears well-developed and well-nourished. No distress.  HENT:  Head: Normocephalic and atraumatic.  Mouth/Throat: Oropharynx is clear and moist and mucous membranes are normal.  Eyes: Pupils are equal, round, and reactive to light. Conjunctivae are normal.  Cardiovascular: Normal rate and regular rhythm.  No murmur heard. Pulses:      Dorsalis pedis pulses are 2+ on the right side.  Respiratory: Effort normal and breath sounds normal. No respiratory distress.  GI: Soft. He exhibits no mass. There is no hepatomegaly. There is no abdominal tenderness.  Musculoskeletal:        General: No edema.     Right elbow: He exhibits normal range of motion, no swelling and no deformity. Tenderness found. Lateral epicondyle tenderness noted.       Arms:     Comments: Pain is not elicit with ROM but with palpation around lateral epicondyle.   Lymphadenopathy:    He has no cervical adenopathy.  Neurological: He is alert and oriented to person, place, and time. He has normal strength. No cranial nerve deficit. Gait normal.  Skin: Skin is warm. No rash noted. No erythema.     3 cm nodular lesion with fluctuant center. Mild local heat,no erythema.  Psychiatric: He has a normal mood and affect. Cognition and memory are normal.  Well groomed, good eye contact.      ASSESSMENT AND PLAN:  [name] was seen today for establish care.  Diagnoses and all orders for this visit:  Abscess of leg, right After verbal consent and in sterile fashion I&D was performed. Local anesthesia with plain  Lidocaine was injected around are before procedure. 1.5 linear incision. Thick yellowish material was obtained. Packing was not necessary. Warm compressions recommended. Keep wound clean with soap and water.  He tolerated procedure well. Cx sent. Bactrim started,will tailor treatment according to Cx results.  -     Sulfamethoxazole-trimethoprim (BACTRIM DS,SEPTRA DS) 800-160 MG tablet; Take 1 tablet by mouth 2 (two) times daily for 7 days. -     WOUND CULTURE  Gastroesophageal reflux disease, esophagitis presence not specified GERD precautions discussed. Smoking cessation encouraged. Omeprazole 40 mg daily for 3-4 weeks then we can try to decrease dose. Instructed about warning signs. F/U in 6 weeks,before if needed.  -  Omeprazole (PRILOSEC) 40 MG capsule; Take 1 capsule (40 mg total) by mouth daily.  Dysphagia, unspecified type We discussed possible etiologies. Smoking cessation recommended. Will treat for GERD. If problem is persistent we will nee to consider EGD.  -     Omeprazole (PRILOSEC) 40 MG capsule; Take 1 capsule (40 mg total) by mouth daily.  Lateral epicondylitis of right elbow Improving. Local ice. Marland Kitchen He prefers to hold on PT.   Return in about 6 weeks (around 04/16/2018) for GERD.     Kristeena Meineke G. [place], MD  [facility]. [place] office.

## 2018-03-05 NOTE — Patient Instructions (Addendum)
A few things to remember from today's visit:   Abscess of leg, right - Plan: sulfamethoxazole-trimethoprim (BACTRIM DS,SEPTRA DS) 800-160 MG tablet, WOUND CULTURE  Gastroesophageal reflux disease, esophagitis presence not specified - Plan: omeprazole (PRILOSEC) 40 MG capsule  Dysphagia, unspecified type - Plan: omeprazole (PRILOSEC) 40 MG capsule  Warm compresses on wound. Monitor for redness, worsening pain, or increased in size.  GERD:  Avoid foods that make your symptoms worse, for example coffee, chocolate,pepermeint,alcohol, and greasy food. Raising the head of your bed about 6 inches may help with nocturnal symptoms.  Avoid tobacco use. Weight loss (if you are overweight). Avoid lying down for 3 hours after eating.  Instead 3 large meals daily try small and more frequent meals during the day.  You should be evaluated immediately if bloody vomiting, bloody stools, black stools (like tar), difficulty swallowing, food gets stuck on the way down or choking when eating. Abnormal weight loss or severe abdominal pain.  If symptoms are not resolved sometimes endoscopy is necessary.  Please be sure medication list is accurate. If a new problem present, please set up appointment sooner than planned today.

## 2018-03-07 ENCOUNTER — Telehealth: Payer: Self-pay | Admitting: Family Medicine

## 2018-03-07 NOTE — Telephone Encounter (Signed)
Copied from [place] [id number]. Topic: General - Other >> Mar 07, 2018 10:24 AM Keene Breath wrote: Reason for CRM: Patient's wife called to request that the doctor prescribe the antibiotic sulfamethoxazole-trimethoprim (BACTRIM DS,SEPTRA DS) 800-160 MG tablet, in liquid form because she states that the patient is choking badly on the tablet and cannot get it to stay down.  Please advise and call wife Brayton Layman) back at [phone] as soon as possible.

## 2018-03-08 ENCOUNTER — Other Ambulatory Visit: Payer: Self-pay | Admitting: Family Medicine

## 2018-03-08 LAB — WOUND CULTURE
MICRO NUMBER:: 21956
SPECIMEN QUALITY:: ADEQUATE

## 2018-03-08 MED ORDER — SULFAMETHOXAZOLE-TRIMETHOPRIM 200-40 MG/5ML PO SUSP: 20.0000 mL | Freq: Two times a day (BID) | ORAL | 0 refills | Status: AC

## 2018-03-08 NOTE — Telephone Encounter (Signed)
Prescription for Septra (liquid) was sent to his pharmacy. Thanks,  BJ

## 2018-03-08 NOTE — Telephone Encounter (Signed)
Rx sent to pharmacy, left detailed message on voicemail.

## 2018-03-08 NOTE — Telephone Encounter (Signed)
Message sent to Dr. Jordan for review and approval. 

## 2018-04-16 ENCOUNTER — Ambulatory Visit: Payer: Medicare Other | Admitting: Family Medicine

## 2018-04-19 ENCOUNTER — Ambulatory Visit (INDEPENDENT_AMBULATORY_CARE_PROVIDER_SITE_OTHER): Payer: Medicare Other | Admitting: Physician Assistant

## 2018-05-14 ENCOUNTER — Ambulatory Visit (INDEPENDENT_AMBULATORY_CARE_PROVIDER_SITE_OTHER): Payer: 59 | Admitting: Orthopedic Surgery

## 2018-05-14 ENCOUNTER — Encounter (INDEPENDENT_AMBULATORY_CARE_PROVIDER_SITE_OTHER): Payer: Self-pay | Admitting: Orthopedic Surgery

## 2018-05-14 ENCOUNTER — Other Ambulatory Visit: Payer: Self-pay

## 2018-05-14 VITALS — Ht 72.0 in | Wt 201.5 lb

## 2018-05-14 DIAGNOSIS — Z89512 Acquired absence of left leg below knee: Secondary | ICD-10-CM | POA: Diagnosis not present

## 2018-05-14 NOTE — Progress Notes (Signed)
Office Visit Note   Patient: Michael Morrison           Date of Birth: September 23, 1960           MRN: [id number] Visit Date: 05/14/2018              Requested by: Martinique, Betty G, [place], [address] [address] PCP: Martinique, Betty G, MD  Chief Complaint  Patient presents with  . Left Leg - Follow-up    Prosthetics eval      HPI: Patient is a 58 year old gentleman who presents for evaluation for his left transtibial amputation prosthesis.  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  Patient presents with a broken socket torn liner.  Assessment & Plan: Visit Diagnoses:  1. History of left below knee amputation [facility])     Plan: Patient is given a prescription for [employer] for a new socket new liner new materials and supplies.  Follow-Up Instructions: Return if symptoms worsen or fail to improve.   Ortho Exam  Patient is alert, oriented, no adenopathy, well-dressed, normal affect, normal respiratory effort. Examination patient's socket is broken anteriorly in the liner is torn he does not have good varus and valgus support.  Patient can get up independently from a low chair and ambulates with a good gait.  Imaging: No results found. No images are attached to the encounter.  Labs: Lab Results  Component Value Date   ESRSEDRATE 87 (H) 05/22/2007   ESRSEDRATE 52 (H) 05/20/2007   LABURIC 4.1 05/20/2007   REPTSTATUS 04/17/2014 FINAL 04/14/2014   GRAMSTAIN  04/14/2014    MODERATE WBC PRESENT,BOTH PMN AND MONONUCLEAR NO SQUAMOUS EPITHELIAL CELLS SEEN FEW GRAM POSITIVE RODS RARE GRAM POSITIVE COCCI IN PAIRS    CULT  04/14/2014    MULTIPLE ORGANISMS PRESENT, NONE PREDOMINANT Note: NO STAPHYLOCOCCUS AUREUS ISOLATED NO GROUP A STREP (S.PYOGENES) ISOLATED Performed at [facility] 05/24/2007     Lab Results  Component Value  Date   ALBUMIN 4.0 11/23/2010   ALBUMIN 3.8 10/25/2010   ALBUMIN 2.8 (L) 05/23/2007   LABURIC 4.1 05/20/2007    Body mass index is 27.33 kg/m.  Orders:  No orders of the defined types were placed in this encounter.  No orders of the defined types were placed in this encounter.    Procedures: No procedures performed  Clinical Data: No additional findings.  ROS:  All other systems negative, except as noted in the HPI. Review of Systems  Objective: Vital Signs: Ht 6' (1.829 m)   Wt 201 lb 8 oz (91.4 kg)   BMI 27.33 kg/m   Specialty Comments:  No specialty comments available.  PMFS History: Patient Active Problem List   Diagnosis Date Noted  . METHICILLIN RESISTANT STAPHYLOCOCCUS AUREUS INFECTION 07/03/2007  . CHRONIC OSTEOMYELITIS ANKLE AND FOOT 07/03/2007  . BKA, LEFT LEG 07/03/2007   Past Medical History:  Diagnosis Date  . 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 GERD (gastroesophageal reflux disease)   . Ureteral obstruction    right ureteropelvic junction obstruction    Family History  Problem Relation Age of Onset  . Diabetes Mother   . Arthritis Mother   . Cancer Mother   . Hyperlipidemia Mother   . Hypertension Mother   .  Arthritis Sister   . Diabetes Sister   . Arthritis Brother   . Cancer Brother   . Diabetes Maternal Grandmother   . Diabetes Brother   . Drug abuse Brother     Past Surgical History:  Procedure Laterality Date  . ANKLE SURGERY  2005 & 2008   left ankle surgery--after fall of 40 feet  . CYSTOSCOPY W/ URETERAL STENT PLACEMENT  02/02/2011   Procedure: CYSTOSCOPY WITH RETROGRADE PYELOGRAM/URETERAL STENT PLACEMENT;  Surgeon: Dutch Gray, MD;  [facility];  Service: Urology;  Laterality: Right;  Cystoscopy Right Retrograde Right Ureteral Stent    (c-arm)   . I&D EXTREMITY Right 11/23/2014   Procedure: VY FLAP TO RIGHT MIDDLE FINGER;   Surgeon: Charlotte Crumb, MD;  Location: [place];  Service: Orthopedics;  Laterality: Right;  . LEG AMPUTATION BELOW KNEE  2009   left below knee amputation -wears prosthesis  . ROBOT ASSISTED PYELOPLASTY  02/02/2011   Procedure: ROBOTIC ASSISTED PYELOPLASTY WITH STENT PLACEMENT;  Surgeon: Dutch Gray, MD;  Location: [facility];  Service: Urology;  Laterality: Right;  Cystoscopy Right Retrograde Right Ureteral Stent    (c-arm)   . tur bt  11/21/10   Social History   Occupational History  . Not on file  Tobacco Use  . Smoking status: Current Every Day Smoker    Packs/day: 0.50    Years: 20.00    Pack years: 10.00    Types: Cigarettes  . Smokeless tobacco: Never Used  Substance and Sexual Activity  . Alcohol use: Yes  . Drug use: No  . Sexual activity: Not on file

## 2018-07-30 ENCOUNTER — Ambulatory Visit (INDEPENDENT_AMBULATORY_CARE_PROVIDER_SITE_OTHER): Payer: 59 | Admitting: Family Medicine

## 2018-07-30 ENCOUNTER — Other Ambulatory Visit: Payer: Self-pay | Admitting: Orthopedic Surgery

## 2018-07-30 ENCOUNTER — Encounter: Payer: Self-pay | Admitting: Family Medicine

## 2018-07-30 ENCOUNTER — Other Ambulatory Visit: Payer: Self-pay

## 2018-07-30 DIAGNOSIS — M533 Sacrococcygeal disorders, not elsewhere classified: Secondary | ICD-10-CM

## 2018-07-30 DIAGNOSIS — B354 Tinea corporis: Secondary | ICD-10-CM

## 2018-07-30 MED ORDER — KETOCONAZOLE 2 % EX CREA: 1.0000 "application " | TOPICAL_CREAM | Freq: Every day | CUTANEOUS | 0 refills | Status: AC

## 2018-07-30 MED ORDER — TERBINAFINE HCL 250 MG PO TABS: 250.0000 mg | ORAL_TABLET | Freq: Every day | ORAL | 0 refills | Status: AC

## 2018-07-30 NOTE — Progress Notes (Signed)
Virtual Visit via Video Note   I connected with Michael Morrison and his wife on 07/30/18 at  9:45 AM EDT by a video enabled telemedicine application and verified that I am speaking with the correct person using two identifiers.  Location patient: home Location provider:home office Persons participating in the virtual visit: patient, provider  I discussed the limitations of evaluation and management by telemedicine and the availability of in person appointments. The patient expressed understanding and agreed to proceed.   HPI: Michael Morrison is a 58 years old AA male complaining about pruritic skin rash. He states that he has had same rash intermittently for 10+ years, usually exacerbated during hot weather. Pruritus is aggravated by sweating.  Noted problem again about 2 weeks ago.  He denies any new medication, detergent, soap, or body product. No known insect bite or outdoor exposures to plants. No sick contact.  OTC medication for this problem: He has tried cortisone.  He denies oral lesions/edema,cough, wheezing, dyspnea, abdominal pain, nausea, or vomiting.  ROS: See pertinent positives and negatives per HPI.  Past Medical History:  Diagnosis Date  . 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 GERD (gastroesophageal reflux disease)   . Ureteral obstruction    right ureteropelvic junction obstruction    Past Surgical History:  Procedure Laterality Date  . ANKLE SURGERY  2005 & 2008   left ankle surgery--after fall of 40 feet  . CYSTOSCOPY W/ URETERAL STENT PLACEMENT  02/02/2011   Procedure: CYSTOSCOPY WITH RETROGRADE PYELOGRAM/URETERAL STENT PLACEMENT;  Surgeon: Dutch Gray, MD;  Location: [facility];  Service: Urology;  Laterality: Right;  Cystoscopy Right Retrograde Right Ureteral Stent    (c-arm)   . I&D EXTREMITY Right 11/23/2014   Procedure: VY FLAP TO RIGHT MIDDLE FINGER;  Surgeon: Charlotte Crumb, MD;  Location: [facility];  Service: Orthopedics;  Laterality: Right;  . LEG AMPUTATION BELOW KNEE  2009   left below knee amputation -wears prosthesis  . ROBOT ASSISTED PYELOPLASTY  02/02/2011   Procedure: ROBOTIC ASSISTED PYELOPLASTY WITH STENT PLACEMENT;  Surgeon: Dutch Gray, MD;  Location: [facility];  Service: Urology;  Laterality: Right;  Cystoscopy Right Retrograde Right Ureteral Stent    (c-arm)   . tur bt  11/21/10    Family History  Problem Relation Age of Onset  . Diabetes Mother   . Arthritis Mother   . Cancer Mother   . Hyperlipidemia Mother   . Hypertension Mother   . Arthritis Sister   . Diabetes Sister   . Arthritis Brother   . Cancer Brother   . Diabetes Maternal Grandmother   . Diabetes Brother   . Drug abuse Brother     Social History   Socioeconomic History  . Marital status: Divorced    Spouse name: Not on file  . Number of children: Not on file  . Years of education: Not on file  . Highest education level: Not on file  Occupational History  . Not on file  Social Needs  . Financial resource strain: Not on file  . Food insecurity:    Worry: Not on file    Inability: Not on file  . Transportation needs:    Medical: Not on file    Non-medical: Not on file  Tobacco Use  . Smoking status: Current Every Day Smoker    Packs/day: 0.50    Years: 20.00    Pack years:  10.00    Types: Cigarettes  . Smokeless tobacco: Never Used  Substance and Sexual Activity  . Alcohol use: Yes  . Drug use: No  . Sexual activity: Not on file  Lifestyle  . Physical activity:    Days per week: Not on file    Minutes per session: Not on file  . Stress: Not on file  Relationships  . Social connections:    Talks on phone: Not on file    Gets together: Not on file    Attends religious service: Not on file    Active member of club or organization: Not on file    Attends meetings of clubs or organizations: Not on file    Relationship status: Not on file  . Intimate  partner violence:    Fear of current or ex partner: Not on file    Emotionally abused: Not on file    Physically abused: Not on file    Forced sexual activity: Not on file  Other Topics Concern  . Not on file  Social History Narrative  . Not on file      Current Outpatient Medications:  .  DICLOFENAC SODIUM PO, Take 75 mg by mouth 2 (two) times daily., Disp: , Rfl:  .  ketoconazole (NIZORAL) 2 % cream, Apply 1 application topically daily for 30 days., Disp: 60 g, Rfl: 0 .  omeprazole (PRILOSEC) 40 MG capsule, TAKE 1 CAPSULE(40 MG) BY MOUTH DAILY, Disp: 90 capsule, Rfl: 2 .  terbinafine (LAMISIL) 250 MG tablet, Take 1 tablet (250 mg total) by mouth daily for 14 days., Disp: 14 tablet, Rfl: 0  EXAM:  VITALS per patient if applicable:N/A  GENERAL: alert, oriented, appears well and in no acute distress  HEENT: atraumatic,normocephalic, conjunctiva clear, no obvious facial abnormalities on inspection.  LUNGS: on inspection no signs of respiratory distress, breathing rate appears normal, no obvious gross SOB, gasping or wheezing  CV: no obvious cyanosis  SKIN: Macular hypopigmented skin lesions and scaly on back and upper chest.  I do not appreciate erythematous lesions.  PSYCH/NEURO: pleasant and cooperative, no obvious depression or anxiety, speech and thought processing grossly intact  ASSESSMENT AND PLAN:  Discussed the following assessment and plan:  Tinea corporis - Plan: terbinafine (LAMISIL) 250 MG tablet, ketoconazole (NIZORAL) 2 % cream  Educated about problem,prognosis,and treatment options. We discussed other possible differential diagnosis. Because the extension of the problem, I recommended trying overall antimycotic medication, we discussed side effects in detail.  Instructed to monitor for abdominal pain, nausea, vomiting among some. Lamisil 250 mg daily for 2 weeks. Topical ketoconazole daily for 2 to 3 weeks. OTC Selsun Blue shampoo to apply daily from neck  down and brings it after 5 minutes.    I discussed the assessment and treatment plan with the patient. He was provided an opportunity to ask questions and all were answered. The patient agreed with the plan and demonstrated an understanding of the instructions.   The patient was advised to call back or seek an in-person evaluation if the symptoms worsen or if the condition fails to improve as anticipated.  Return if symptoms worsen or fail to improve.    Tatia Petrucci [place], MD

## 2018-10-28 ENCOUNTER — Ambulatory Visit: Payer: Medicare Other | Admitting: Family Medicine

## 2018-10-28 ENCOUNTER — Other Ambulatory Visit: Payer: Self-pay

## 2018-11-05 ENCOUNTER — Ambulatory Visit: Payer: Self-pay | Admitting: *Deleted

## 2018-11-05 ENCOUNTER — Other Ambulatory Visit: Payer: Self-pay | Admitting: Family Medicine

## 2018-11-05 DIAGNOSIS — R1310 Dysphagia, unspecified: Secondary | ICD-10-CM

## 2018-11-05 DIAGNOSIS — K219 Gastro-esophageal reflux disease without esophagitis: Secondary | ICD-10-CM

## 2018-11-05 NOTE — Telephone Encounter (Signed)
Medication Refill - Medication: omeprazole (PRILOSEC) 40 MG capsule    Has the patient contacted their pharmacy? Yes.   (Agent: If no, request that the patient contact the pharmacy for the refill.) (Agent: If yes, when and what did the pharmacy advise?)  Preferred Pharmacy (with phone number or street name):  [facility] [place] [facility] [address]  [place] [address]  Phone: [phone] Fax: [phone]     Agent: Please be advised that RX refills may take up to 3 business days. We ask that you follow-up with your pharmacy.

## 2018-11-05 NOTE — Telephone Encounter (Signed)
Three attempts to reach pt. Clinical call message:  Summary: Acid refflux questions    Pt reported slight chest tightness and acid reflux for the past 3 days, currently not experiencing any symptoms just has questions and would like to speak to a nurse.   CB# [phone]

## 2018-11-05 NOTE — Telephone Encounter (Signed)
Requested medication (s) are due for refill today: yes  Requested medication (s) are on the active medication list: yes  Last refill:  03/05/2018  Future visit scheduled: yes  Notes to clinic: requesting 90 day supply   Requested Prescriptions  Pending Prescriptions Disp Refills   omeprazole (PRILOSEC) 40 MG capsule 90 capsule 2    Sig: TAKE 1 CAPSULE(40 MG) BY MOUTH DAILY     Gastroenterology: Proton Pump Inhibitors Passed - 11/05/2018  3:22 PM      Passed - Valid encounter within last 12 months    Recent Outpatient Visits          3 months ago Tinea corporis   Therapist, music at [facility] [place], Malka So, MD   8 months ago Abscess of leg, right   Therapist, [facility] at [facility] [place], Malka So, MD      Future Appointments            In 1 week [place], Malka So, MD [employer] [facility] [place]

## 2018-11-06 MED ORDER — OMEPRAZOLE 40 MG PO CPDR: DELAYED_RELEASE_CAPSULE | ORAL | 2 refills | Status: DC

## 2018-11-07 NOTE — Telephone Encounter (Signed)
Tried to call pt no answer. Lm for a return phone call.

## 2018-11-13 ENCOUNTER — Encounter: Payer: Self-pay | Admitting: Family Medicine

## 2018-11-13 ENCOUNTER — Ambulatory Visit (INDEPENDENT_AMBULATORY_CARE_PROVIDER_SITE_OTHER): Payer: Medicare Other | Admitting: Family Medicine

## 2018-11-13 ENCOUNTER — Other Ambulatory Visit: Payer: Self-pay

## 2018-11-13 VITALS — BP 130/90 | HR 62 | Temp 97.7°F | Resp 12 | Ht 72.0 in | Wt 195.5 lb

## 2018-11-13 DIAGNOSIS — R1310 Dysphagia, unspecified: Secondary | ICD-10-CM

## 2018-11-13 DIAGNOSIS — K219 Gastro-esophageal reflux disease without esophagitis: Secondary | ICD-10-CM | POA: Diagnosis not present

## 2018-11-13 DIAGNOSIS — F17200 Nicotine dependence, unspecified, uncomplicated: Secondary | ICD-10-CM | POA: Insufficient documentation

## 2018-11-13 DIAGNOSIS — R634 Abnormal weight loss: Secondary | ICD-10-CM | POA: Diagnosis not present

## 2018-11-13 DIAGNOSIS — Z23 Encounter for immunization: Secondary | ICD-10-CM

## 2018-11-13 MED ORDER — OMEPRAZOLE 40 MG PO CPDR: DELAYED_RELEASE_CAPSULE | ORAL | 3 refills | Status: DC

## 2018-11-13 NOTE — Patient Instructions (Addendum)
A few things to remember from today's visit:   Gastroesophageal reflux disease, esophagitis presence not specified - Plan: omeprazole (PRILOSEC) 40 MG capsule  Dysphagia, unspecified type - Plan: omeprazole (PRILOSEC) 40 MG capsule  Tobacco use disorder Monitor blood pressure at home. Goal less than  140/90. Food Choices for Gastroesophageal Reflux Disease, Adult When you have gastroesophageal reflux disease (GERD), the foods you eat and your eating habits are very important. Choosing the right foods can help ease your discomfort. Think about working with a nutrition specialist (dietitian) to help you make good choices. What are tips for following this plan?  Meals  Choose healthy foods that are low in fat, such as fruits, vegetables, whole grains, low-fat dairy products, and lean meat, fish, and poultry.  Eat small meals often instead of 3 large meals a day. Eat your meals slowly, and in a place where you are relaxed. Avoid bending over or lying down until 2-3 hours after eating.  Avoid eating meals 2-3 hours before bed.  Avoid drinking a lot of liquid with meals.  Cook foods using methods other than frying. Bake, grill, or broil food instead.  Avoid or limit: ? Chocolate. ? Peppermint or spearmint. ? Alcohol. ? Pepper. ? Black and decaffeinated coffee. ? Black and decaffeinated tea. ? Bubbly (carbonated) soft drinks. ? Caffeinated energy drinks and soft drinks.  Limit high-fat foods such as: ? Fatty meat or fried foods. ? Whole milk, cream, butter, or ice cream. ? Nuts and nut butters. ? Pastries, donuts, and sweets made with butter or shortening.  Avoid foods that cause symptoms. These foods may be different for everyone. Common foods that cause symptoms include: ? Tomatoes. ? Oranges, lemons, and limes. ? Peppers. ? Spicy food. ? Onions and garlic. ? Vinegar. Lifestyle  Maintain a healthy weight. Ask your doctor what weight is healthy for you. If you need to  lose weight, work with your doctor to do so safely.  Exercise for at least 30 minutes for 5 or more days each week, or as told by your doctor.  Wear loose-fitting clothes.  Do not smoke. If you need help quitting, ask your doctor.  Sleep with the head of your bed higher than your feet. Use a wedge under the mattress or blocks under the bed frame to raise the head of the bed. Summary  When you have gastroesophageal reflux disease (GERD), food and lifestyle choices are very important in easing your symptoms.  Eat small meals often instead of 3 large meals a day. Eat your meals slowly, and in a place where you are relaxed.  Limit high-fat foods such as fatty meat or fried foods.  Avoid bending over or lying down until 2-3 hours after eating.  Avoid peppermint and spearmint, caffeine, alcohol, and chocolate. This information is not intended to replace advice given to you by your health care provider. Make sure you discuss any questions you have with your health care provider. Document Released: 08/15/2011 Document Revised: 06/06/2018 Document Reviewed: 03/21/2016 Elsevier Patient Education  [employer].  Please be sure medication list is accurate. If a new problem present, please set up appointment sooner than planned today.

## 2018-11-13 NOTE — Telephone Encounter (Signed)
Patient had ov on 11/13/2018 to discuss with PCP.

## 2018-11-13 NOTE — Progress Notes (Signed)
HPI:  Chief Complaint  Patient presents with  . Gastroesophageal Reflux    started 2 or 3 weeks ago    Michael Morrison is a 58 y.o. male, who is here today complaining of food getting "stuck" in the middle of his chest for the past 2 to 3 weeks. + Heartburn.  He has had this problem before, resolved with omeprazole 40 mg daily. He ran out of medication, so symptoms reoccurred.  He has not identified exacerbating factors.   Wt loss for a few months and decreased appetite. Current smoker.  He denies abdominal pain, nausea, vomiting, melena, blood in the stool, or urinary symptoms. He is not sure about how much weight he has lost.  Negative for chills, fever, night sweats,fatigue, sore throat, exertional chest pain, dyspnea, palpitations, focal weakness, or tremor.   Review of Systems  Constitutional: Negative for activity change and appetite change.  HENT: Negative for mouth sores, nosebleeds, sore throat and voice change.   Respiratory: Negative for cough and wheezing.   Cardiovascular: Negative for leg swelling.  Gastrointestinal:       No changes in bowel habits.  Endocrine: Negative for cold intolerance and heat intolerance.  Genitourinary: Negative for decreased urine volume, dysuria, hematuria and urgency.  Skin: Negative for rash and wound.  Neurological: Negative for syncope and headaches.  Rest see pertinent positives and negatives per HPI.   No current outpatient medications on file prior to visit.   No current facility-administered medications on file prior to visit.      Past Medical History:  Diagnosis Date  . 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 GERD (gastroesophageal reflux disease)   . Ureteral obstruction    right ureteropelvic junction obstruction   Allergies  Allergen Reactions  . Other     Social History   Socioeconomic  History  . Marital status: Divorced    Spouse name: Not on file  . Number of children: Not on file  . Years of education: Not on file  . Highest education level: Not on file  Occupational History  . Not on file  Social Needs  . Financial resource strain: Not on file  . Food insecurity    Worry: Not on file    Inability: Not on file  . Transportation needs    Medical: Not on file    Non-medical: Not on file  Tobacco Use  . Smoking status: Current Every Day Smoker    Packs/day: 0.50    Years: 20.00    Pack years: 10.00    Types: Cigarettes  . Smokeless tobacco: Never Used  Substance and Sexual Activity  . Alcohol use: Yes  . Drug use: No  . Sexual activity: Not on file  Lifestyle  . Physical activity    Days per week: Not on file    Minutes per session: Not on file  . Stress: Not on file  Relationships  . Social Herbalist on phone: Not on file    Gets together: Not on file    Attends religious service: Not on file    Active member of club or organization: Not on file    Attends meetings of clubs or organizations: Not on file    Relationship status: Not on file  Other Topics Concern  . Not on file  Social History Narrative  .  Not on file    Vitals:   11/13/18 1149  BP: 130/90  Pulse: 62  Resp: 12  Temp: 97.7 F (36.5 C)  SpO2: 98%   Body mass index is 26.51 kg/m.   Wt Readings from Last 3 Encounters:  11/13/18 195 lb 8 oz (88.7 kg)  05/14/18 201 lb 8 oz (91.4 kg)  03/05/18 201 lb 8 oz (91.4 kg)    Physical Exam  Nursing note reviewed. Constitutional: He is oriented to person, place, and time. He appears well-developed and well-nourished. No distress.  HENT:  Head: Normocephalic and atraumatic.  Mouth/Throat: Oropharynx is clear and moist and mucous membranes are normal.  Eyes: Pupils are equal, round, and reactive to light. Conjunctivae are normal.  Cardiovascular: Normal rate and regular rhythm.  No murmur heard. Pulses:       Dorsalis pedis pulses are 2+ on the right side.  S/P left AKA.  Respiratory: Effort normal and breath sounds normal. No respiratory distress.  GI: Soft. He exhibits no mass. There is no hepatomegaly. There is no abdominal tenderness.  Musculoskeletal:        General: No edema.  Lymphadenopathy:    He has no cervical adenopathy.  Neurological: He is alert and oriented to person, place, and time. He has normal strength. No cranial nerve deficit. Gait normal.  Skin: Skin is warm. No rash noted. No erythema.  Psychiatric: He has a normal mood and affect. Cognition and memory are normal.  Well groomed, good eye contact.    ASSESSMENT AND PLAN:  Michael Morrison was seen today for gastroesophageal reflux.  Diagnoses and all orders for this visit:  Gastroesophageal reflux disease, esophagitis presence not specified GERD precautions. Continue Omeprazole 40 mg daily. He was instructed to let me know if symptom re-occur, in which case GI referral needs to be considered.  F/U in 3 months. -     omeprazole (PRILOSEC) 40 MG capsule; TAKE 1 CAPSULE(40 MG) BY MOUTH DAILY  Dysphagia, unspecified type Resolved with PPI. Some other etiologies discussed. Instructed about warning signs.  -     omeprazole (PRILOSEC) 40 MG capsule; TAKE 1 CAPSULE(40 MG) BY MOUTH DAILY -     CBC with Differential/Platelet; Future -     TSH; Future  Tobacco use disorder Discussed adverse effects and benefits of smoking cessation. He is not ready to quit.  Need for immunization against influenza -     Flu Vaccine QUAD 36+ mos IM  Weight loss, non-intentional He mentions decreased appetite, so this could be the reason for wt loss. Other possible causes discussed.  Hx of bladder cancer, follows with Dr Alinda Money (urologist). Colonoscopy 07/2015, Dr Benson Norway.   -     CBC with Differential/Platelet; Future -     Comprehensive metabolic panel; Future -     TSH; Future -     Urinalysis, Routine w reflex microscopic; Future     Return in 3 months (on 02/12/2019) for wt loss,GERD.   -MichaelMedgar Morrison was advised to seek immediate medical attention if sudden worsening symptoms or to follow if they persist or if new concerns arise.    G. [place], MD  [facility]. [place] office.

## 2018-11-27 NOTE — Progress Notes (Signed)
Patient is scheduled December 16

## 2019-02-12 ENCOUNTER — Ambulatory Visit: Payer: Medicare Other | Admitting: Family Medicine

## 2019-02-17 ENCOUNTER — Ambulatory Visit: Payer: Medicare Other | Admitting: Family Medicine

## 2019-03-18 ENCOUNTER — Other Ambulatory Visit: Payer: Self-pay | Admitting: Orthopedic Surgery

## 2019-03-18 DIAGNOSIS — M259 Joint disorder, unspecified: Secondary | ICD-10-CM

## 2019-03-25 ENCOUNTER — Inpatient Hospital Stay: Admission: RE | Admit: 2019-03-25 | Payer: Medicare Other | Source: Ambulatory Visit

## 2019-03-25 ENCOUNTER — Other Ambulatory Visit: Payer: Medicare Other

## 2019-03-31 ENCOUNTER — Other Ambulatory Visit: Payer: Medicare Other

## 2019-04-10 ENCOUNTER — Encounter: Payer: Self-pay | Admitting: Physician Assistant

## 2019-04-10 ENCOUNTER — Ambulatory Visit: Payer: Self-pay

## 2019-04-10 ENCOUNTER — Other Ambulatory Visit: Payer: Self-pay

## 2019-04-10 ENCOUNTER — Ambulatory Visit (INDEPENDENT_AMBULATORY_CARE_PROVIDER_SITE_OTHER): Payer: Medicare Other | Admitting: Orthopedic Surgery

## 2019-04-10 ENCOUNTER — Ambulatory Visit: Payer: Medicare Other | Admitting: Physician Assistant

## 2019-04-10 VITALS — Ht 72.0 in | Wt 195.0 lb

## 2019-04-10 DIAGNOSIS — M5442 Lumbago with sciatica, left side: Secondary | ICD-10-CM | POA: Diagnosis not present

## 2019-04-10 DIAGNOSIS — G8929 Other chronic pain: Secondary | ICD-10-CM | POA: Diagnosis not present

## 2019-04-10 DIAGNOSIS — M25561 Pain in right knee: Secondary | ICD-10-CM

## 2019-04-10 MED ORDER — PREDNISONE 10 MG PO TABS: 20.0000 mg | ORAL_TABLET | Freq: Every day | ORAL | 0 refills | Status: DC

## 2019-04-10 MED ORDER — LIDOCAINE HCL (PF) 1 % IJ SOLN
5.0000 mL | INTRAMUSCULAR | Status: AC | PRN
  Administered 2019-04-10: 13:00:00 5 mL

## 2019-04-10 MED ORDER — METHYLPREDNISOLONE ACETATE 40 MG/ML IJ SUSP
40.0000 mg | INTRAMUSCULAR | Status: AC | PRN
  Administered 2019-04-10: 13:00:00 40 mg via INTRA_ARTICULAR

## 2019-04-10 NOTE — Progress Notes (Signed)
Office Visit Note   Patient: Michael Morrison           Date of Birth: 1960/08/14           MRN: [id number] Visit Date: 04/10/2019              Requested by: Martinique, Betty G, [address] [place],  [place] [address] PCP: Martinique, Betty G, MD  Chief Complaint  Patient presents with  . Lower Back - Pain  . 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  Patient also complains of medial joint line pain of the right knee with swelling that has not resolved with ice elevation and anti-inflammatories he is currently using a neoprene sleeve.  Assessment & Plan: Visit Diagnoses:  1. Chronic left-sided low back pain with left-sided sciatica   2. Acute pain of right knee     Plan: The right knee was injected he tolerated this well we will start him off on a low-dose prednisone to help with the radicular symptoms.  At follow-up if he is still symptomatic would set him up for an MRI scan for the possibility of an epidural steroid injection.  Follow-Up Instructions: Return in about 4 weeks (around 05/08/2019).   Ortho Exam  Patient is alert, oriented, no adenopathy, well-dressed, normal affect, normal respiratory effort. Examination patient has an antalgic gait he has a positive straight leg raise on the left no focal motor weakness he has a left transtibial amputation.  Right lower extremity he has mild varus to the right knee he is point tender to palpation of the medial joint line he does have a mild effusion there is no redness no cellulitis collaterals and cruciates are stable.  Imaging: XR Knee 1-2 Views Right  Result Date: 04/10/2019 2 view radiographs of the right knee shows joint space narrowing of the medial joint line osteophytic bone spurs in the  patellofemoral joint with subchondral sclerosis of the medial joint line with mild varus alignment  XR Lumbar Spine 2-3 Views  Result Date: 04/10/2019 2 view radiographs of the lumbar spine shows mild degenerative disc disease with spurring calcification of the aorta without aneurysm.  No images are attached to the encounter.  Labs: Lab Results  Component Value Date   ESRSEDRATE 87 (H) 05/22/2007   ESRSEDRATE 52 (H) 05/20/2007   LABURIC 4.1 05/20/2007   REPTSTATUS 04/17/2014 FINAL 04/14/2014   GRAMSTAIN  04/14/2014    MODERATE WBC PRESENT,BOTH PMN AND MONONUCLEAR NO SQUAMOUS EPITHELIAL CELLS SEEN FEW GRAM POSITIVE RODS RARE GRAM POSITIVE COCCI IN PAIRS    CULT  04/14/2014    MULTIPLE ORGANISMS PRESENT, NONE PREDOMINANT Note: NO STAPHYLOCOCCUS AUREUS ISOLATED NO GROUP A STREP (S.PYOGENES) ISOLATED Performed at [facility] 05/24/2007     Lab Results  Component Value Date   ALBUMIN 4.0 11/23/2010   ALBUMIN 3.8 10/25/2010   ALBUMIN 2.8 (L) 05/23/2007   LABURIC 4.1 05/20/2007    No results found for: MG No results found for: VD25OH  No results found for: PREALBUMIN CBC EXTENDED Latest Ref Rng & Units 11/23/2014 02/03/2011 02/03/2011  WBC 4.0 - 10.5 K/uL 6.1 - -  RBC 4.22 - 5.81 MIL/uL 4.59 - -  HGB 13.0 - 17.0 g/dL 13.8 11.4(L) 10.9(L)  HCT 39.0 - 52.0 % 41.6 34.3(L) 33.2(L)  PLT 150 - 400 K/uL PLATELETS APPEAR ADEQUATE - -  NEUTROABS 1.7 - 7.7 K/uL 3.2 - -  LYMPHSABS 0.7 - 4.0 K/uL 2.4 - -     Body mass index is 26.45 kg/m.  Orders:  Orders Placed This Encounter  Procedures  . XR Knee 1-2 Views Right  . XR Lumbar Spine 2-3 Views   Meds ordered this encounter  Medications  . predniSONE (DELTASONE) 10 MG tablet    Sig: Take 2 tablets (20 mg total) by mouth daily with breakfast.    Dispense:  60 tablet    Refill:  0     Procedures: Large Joint Inj: R knee on 04/10/2019 12:48 PM Indications:  pain and diagnostic evaluation Details: 22 G 1.5 in needle, anteromedial approach  Arthrogram: No  Medications: 5 mL lidocaine (PF) 1 %; 40 mg methylPREDNISolone acetate 40 MG/ML Outcome: tolerated well, no immediate complications Procedure, treatment alternatives, risks and benefits explained, specific risks discussed. Consent was given by the patient. Immediately prior to procedure a time out was called to verify the correct patient, procedure, equipment, support staff and site/side marked as required. Patient was prepped and draped in the usual sterile fashion.      Clinical Data: No additional findings.  ROS:  All other systems negative, except as noted in the HPI. Review of Systems  Objective: Vital Signs: Ht 6' (1.829 m)   Wt 195 lb (88.5 kg)   BMI 26.45 kg/m   Specialty Comments:  No specialty comments available.  PMFS History: Patient Active Problem List   Diagnosis Date Noted  . Tobacco use disorder 11/13/2018  . Gastroesophageal reflux disease 11/13/2018  . METHICILLIN RESISTANT STAPHYLOCOCCUS AUREUS INFECTION 07/03/2007  . CHRONIC OSTEOMYELITIS ANKLE AND FOOT 07/03/2007  . BKA, LEFT LEG 07/03/2007   Past Medical History:  Diagnosis Date  . 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 GERD (gastroesophageal reflux disease)   . Ureteral obstruction    right ureteropelvic junction obstruction    Family History  Problem Relation Age of Onset  . Diabetes Mother   . Arthritis Mother   . Cancer Mother   . Hyperlipidemia Mother   . Hypertension Mother   . Arthritis Sister   . Diabetes Sister   . Arthritis Brother   . Cancer Brother   . Diabetes Maternal Grandmother   . Diabetes Brother   . Drug abuse Brother     Past Surgical History:  Procedure Laterality Date  . ANKLE SURGERY  2005 & 2008   left ankle surgery--after fall of 40 feet  . CYSTOSCOPY W/ URETERAL  STENT PLACEMENT  02/02/2011   Procedure: CYSTOSCOPY WITH RETROGRADE PYELOGRAM/URETERAL STENT PLACEMENT;  Surgeon: Dutch Gray, MD;  Location: [facility];  Service: Urology;  Laterality: Right;  Cystoscopy Right Retrograde Right Ureteral Stent    (c-arm)   . I & D EXTREMITY Right 11/23/2014   Procedure: VY FLAP TO RIGHT MIDDLE FINGER;  Surgeon: Charlotte Crumb, MD;  Location: [facility];  Service: Orthopedics;  Laterality: Right;  . LEG AMPUTATION BELOW KNEE  2009   left below knee amputation -wears prosthesis  . ROBOT ASSISTED PYELOPLASTY  02/02/2011   Procedure: ROBOTIC ASSISTED PYELOPLASTY WITH STENT PLACEMENT;  Surgeon: Dutch Gray, MD;  Location: [facility];  Service: Urology;  Laterality: Right;  Cystoscopy  Right Retrograde Right Ureteral Stent    (c-arm)   . tur bt  11/21/10   Social History   Occupational History  . Not on file  Tobacco Use  . Smoking status: Current Every Day Smoker    Packs/day: 0.50    Years: 20.00    Pack years: 10.00    Types: Cigarettes  . Smokeless tobacco: Never Used  Substance and Sexual Activity  . Alcohol use: Yes  . Drug use: No  . Sexual activity: Not on file

## 2019-04-14 ENCOUNTER — Telehealth: Payer: Self-pay | Admitting: Orthopedic Surgery

## 2019-04-14 NOTE — Telephone Encounter (Signed)
Patient's wife called.   He is still experiencing a lot of pain since his last appointment and is requesting pain medicine.   Call back number: [phone]

## 2019-04-14 NOTE — Telephone Encounter (Signed)
I spoke with the patients wife. Sounds like he is having a steroid flare. He is on prednisone for his back. I advised tylenol and elevating. I warned her that he should not take more than 2 tylenol at a time

## 2019-04-21 ENCOUNTER — Ambulatory Visit: Payer: Medicare Other | Admitting: Orthopedic Surgery

## 2019-04-22 ENCOUNTER — Other Ambulatory Visit: Payer: Self-pay

## 2019-04-22 ENCOUNTER — Encounter: Payer: Self-pay | Admitting: Family

## 2019-04-22 ENCOUNTER — Ambulatory Visit (INDEPENDENT_AMBULATORY_CARE_PROVIDER_SITE_OTHER): Payer: Medicare Other | Admitting: Family

## 2019-04-22 VITALS — Ht 72.0 in | Wt 195.0 lb

## 2019-04-22 DIAGNOSIS — G8929 Other chronic pain: Secondary | ICD-10-CM | POA: Diagnosis not present

## 2019-04-22 DIAGNOSIS — M5442 Lumbago with sciatica, left side: Secondary | ICD-10-CM

## 2019-04-22 DIAGNOSIS — M25561 Pain in right knee: Secondary | ICD-10-CM | POA: Diagnosis not present

## 2019-04-22 MED ORDER — NAPROXEN 500 MG PO TABS: 500.0000 mg | ORAL_TABLET | Freq: Two times a day (BID) | ORAL | 0 refills | Status: DC | PRN

## 2019-04-22 NOTE — Progress Notes (Signed)
Office Visit Note   Patient: Michael Morrison           Date of Birth: 05-22-1960           MRN: [id number] Visit Date: 04/22/2019              Requested by: Martinique, Betty G, [address] [place],  [place] [address] PCP: Martinique, Betty G, MD  Chief Complaint  Patient presents with  . Left Hip - Pain  . Right Knee - Pain      HPI: Patient is a 59 year old gentleman seen today in follow up for 2 separate issues.   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    Patient also complains of medial joint line pain of the right knee following a twisting injury 3 weeks ago with swelling that has not resolved with ice elevation and anti-inflammatories he is currently using a neoprene sleeve. Also had a depomedrol injection 2 weeks ago without relief.  Assessment & Plan: Visit Diagnoses:  1. Chronic left-sided low back pain with left-sided sciatica   2. Acute pain of right knee     Plan: will plan for MRI right knee to evaluation of meniscal injury. Mri lumbar spine, pending results may refer to [facility] for esi. He'll use naproxen for pain for next two weeks, bid.   Follow-Up Instructions: No follow-ups on file.   Left Hip Exam  Left hip exam is normal.      Patient is alert, oriented, no adenopathy, well-dressed, normal affect, normal respiratory effort. Examination patient has an antalgic gait he has a positive straight leg raise on the left no focal motor weakness he has a left transtibial amputation.  Right lower extremity he has mild varus to the right knee. He remains point tender to palpation of the medial joint line. No edema today.  no redness no cellulitis collaterals and cruciates are stable.  Imaging: No results found. No images are attached to the encounter.  Labs: Lab Results   Component Value Date   ESRSEDRATE 87 (H) 05/22/2007   ESRSEDRATE 52 (H) 05/20/2007   LABURIC 4.1 05/20/2007   REPTSTATUS 04/17/2014 FINAL 04/14/2014   GRAMSTAIN  04/14/2014    MODERATE WBC PRESENT,BOTH PMN AND MONONUCLEAR NO SQUAMOUS EPITHELIAL CELLS SEEN FEW GRAM POSITIVE RODS RARE GRAM POSITIVE COCCI IN PAIRS    CULT  04/14/2014    MULTIPLE ORGANISMS PRESENT, NONE PREDOMINANT Note: NO STAPHYLOCOCCUS AUREUS ISOLATED NO GROUP A STREP (S.PYOGENES) ISOLATED Performed at [facility] 05/24/2007     Lab Results  Component Value Date   ALBUMIN 4.0 11/23/2010   ALBUMIN 3.8 10/25/2010   ALBUMIN 2.8 (L) 05/23/2007   LABURIC 4.1 05/20/2007    No results found for: MG No results found for: VD25OH  No results found for: PREALBUMIN CBC EXTENDED Latest Ref Rng & Units 11/23/2014 02/03/2011 02/03/2011  WBC 4.0 - 10.5 K/uL 6.1 - -  RBC 4.22 - 5.81 MIL/uL 4.59 - -  HGB 13.0 - 17.0 g/dL 13.8 11.4(L) 10.9(L)  HCT 39.0 - 52.0 % 41.6 34.3(L) 33.2(L)  PLT 150 - 400 K/uL PLATELETS APPEAR ADEQUATE - -  NEUTROABS 1.7 - 7.7 K/uL 3.2 - -  LYMPHSABS 0.7 - 4.0 K/uL 2.4 - -  Body mass index is 26.45 kg/m.  Orders:  Orders Placed This Encounter  Procedures  . MR Knee Right w/o contrast  . MR Lumbar Spine w/o contrast   Meds ordered this encounter  Medications  . naproxen (NAPROSYN) 500 MG tablet    Sig: Take 1 tablet (500 mg total) by mouth 2 (two) times daily as needed for mild pain or moderate pain. With meals    Dispense:  60 tablet    Refill:  0     Procedures: No procedures performed  Clinical Data: No additional findings.  ROS:  All other systems negative, except as noted in the HPI. Review of Systems  Constitutional: Negative for chills and fever.  Musculoskeletal: Positive for arthralgias, joint swelling and myalgias. Negative for back pain.  Neurological: Negative for weakness and numbness.  All  other systems reviewed and are negative.   Objective: Vital Signs: Ht 6' (1.829 m)   Wt 195 lb (88.5 kg)   BMI 26.45 kg/m   Specialty Comments:  No specialty comments available.  PMFS History: Patient Active Problem List   Diagnosis Date Noted  . Tobacco use disorder 11/13/2018  . Gastroesophageal reflux disease 11/13/2018  . METHICILLIN RESISTANT STAPHYLOCOCCUS AUREUS INFECTION 07/03/2007  . CHRONIC OSTEOMYELITIS ANKLE AND FOOT 07/03/2007  . BKA, LEFT LEG 07/03/2007   Past Medical History:  Diagnosis Date  . 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 GERD (gastroesophageal reflux disease)   . Ureteral obstruction    right ureteropelvic junction obstruction    Family History  Problem Relation Age of Onset  . Diabetes Mother   . Arthritis Mother   . Cancer Mother   . Hyperlipidemia Mother   . Hypertension Mother   . Arthritis Sister   . Diabetes Sister   . Arthritis Brother   . Cancer Brother   . Diabetes Maternal Grandmother   . Diabetes Brother   . Drug abuse Brother     Past Surgical History:  Procedure Laterality Date  . ANKLE SURGERY  2005 & 2008   left ankle surgery--after fall of 40 feet  . CYSTOSCOPY W/ URETERAL STENT PLACEMENT  02/02/2011   Procedure: CYSTOSCOPY WITH RETROGRADE PYELOGRAM/URETERAL STENT PLACEMENT;  Surgeon: Dutch Gray, MD;  [facility];  Service: Urology;  Laterality: Right;  Cystoscopy Right Retrograde Right Ureteral Stent    (c-arm)   . I & D EXTREMITY Right 11/23/2014   Procedure: VY FLAP TO RIGHT MIDDLE FINGER;  Surgeon: Charlotte Crumb, MD;  Location: [facility];  Service: Orthopedics;  Laterality: Right;  . LEG AMPUTATION BELOW KNEE  2009   left below knee amputation -wears prosthesis  . ROBOT ASSISTED PYELOPLASTY  02/02/2011   Procedure: ROBOTIC ASSISTED PYELOPLASTY WITH STENT PLACEMENT;  Surgeon: Dutch Gray, MD;  Location[facility];  Service:  Urology;  Laterality: Right;  Cystoscopy Right Retrograde Right Ureteral Stent    (c-arm)   . tur bt  11/21/10   Social History   Occupational History  . Not on file  Tobacco Use  . Smoking status: Current Every Day Smoker    Packs/day: 0.50    Years: 20.00    Pack years: 10.00    Types: Cigarettes  . Smokeless tobacco: Never Used  Substance and Sexual Activity  . Alcohol use: Yes  . Drug use: No  . Sexual activity: Not on file

## 2019-04-24 ENCOUNTER — Telehealth: Payer: Self-pay | Admitting: Family

## 2019-04-24 NOTE — Telephone Encounter (Signed)
Patient wife Michael Morrison called requesting when MRI appointment is set please call [phone]. Husband sometimes cant hear his phone.

## 2019-04-24 NOTE — Telephone Encounter (Signed)
When the MRIs are sch can you please call this number to advise.

## 2019-04-24 NOTE — Telephone Encounter (Signed)
Noted and copied it to the referral.

## 2019-05-08 ENCOUNTER — Ambulatory Visit: Payer: Medicare Other | Admitting: Orthopedic Surgery

## 2019-05-15 ENCOUNTER — Ambulatory Visit: Payer: Medicare Other | Attending: Family

## 2019-05-15 DIAGNOSIS — Z23 Encounter for immunization: Secondary | ICD-10-CM

## 2019-05-15 NOTE — Progress Notes (Signed)
   Covid-19 Vaccination Clinic  Name:  Michael Morrison    MRN: [id number] DOB: 11/27/1960  05/15/2019  [name] was observed post Covid-19 immunization for 15 minutes without incident. He was provided with Vaccine Information Sheet and instruction to access the V-Safe system.   [name] was instructed to call 911 with any severe reactions post vaccine: Marland Kitchen Difficulty breathing  . Swelling of face and throat  . A fast heartbeat  . A bad rash all over body  . Dizziness and weakness   Immunizations Administered    Name Date Dose VIS Date Route   Moderna COVID-19 Vaccine 05/15/2019  3:08 PM 0.5 mL 01/28/2019 Intramuscular   Manufacturer: Moderna   Lot: [id number]   [id number]

## 2019-05-16 ENCOUNTER — Ambulatory Visit
Admission: RE | Admit: 2019-05-16 | Discharge: 2019-05-16 | Disposition: A | Discharge status: Home / Self Care | Payer: 59 | Source: Ambulatory Visit | Attending: Family | Admitting: Family

## 2019-05-16 DIAGNOSIS — M48061 Spinal stenosis, lumbar region without neurogenic claudication: Secondary | ICD-10-CM | POA: Diagnosis not present

## 2019-05-16 DIAGNOSIS — M25561 Pain in right knee: Secondary | ICD-10-CM | POA: Diagnosis not present

## 2019-05-16 DIAGNOSIS — G8929 Other chronic pain: Secondary | ICD-10-CM

## 2019-05-19 ENCOUNTER — Telehealth: Payer: Self-pay

## 2019-05-19 ENCOUNTER — Other Ambulatory Visit: Payer: Self-pay | Admitting: Family

## 2019-05-19 NOTE — Telephone Encounter (Signed)
Pt has an appt tomorrow for review.

## 2019-05-19 NOTE — Telephone Encounter (Signed)
MRI is reviewed there is medial meniscal tear degenerative arthritic changes of the medial joint line with a knee effusion with stress reaction to the medial femoral condyle and medial tibial plateau.

## 2019-05-19 NOTE — Telephone Encounter (Signed)
Michael Morrison with [facility] called with a call report for patient. She states Erin ordered  MRI right knee.  MRI was done yesterday 05/18/2019. Would like for Erin or Dr. Sharol Given to see report ASAP.  IMPRESSION: 1. High-grade medial compartment cartilage loss with acute subchondral fracture involving the medial tibial plateau with extensive surrounding bone marrow edema. More subtle low T1/T2 signal changes within the subchondral bone of the medial femoral condyle raises suspicion for an additional subchondral fracture at this site.

## 2019-05-20 ENCOUNTER — Other Ambulatory Visit: Payer: Self-pay

## 2019-05-20 ENCOUNTER — Encounter: Payer: Self-pay | Admitting: Orthopedic Surgery

## 2019-05-20 ENCOUNTER — Ambulatory Visit (INDEPENDENT_AMBULATORY_CARE_PROVIDER_SITE_OTHER): Payer: 59 | Admitting: Orthopedic Surgery

## 2019-05-20 VITALS — Ht 72.0 in | Wt 195.0 lb

## 2019-05-20 DIAGNOSIS — Z89512 Acquired absence of left leg below knee: Secondary | ICD-10-CM | POA: Diagnosis not present

## 2019-05-20 DIAGNOSIS — M25561 Pain in right knee: Secondary | ICD-10-CM

## 2019-05-20 DIAGNOSIS — M5442 Lumbago with sciatica, left side: Secondary | ICD-10-CM | POA: Diagnosis not present

## 2019-05-20 DIAGNOSIS — G8929 Other chronic pain: Secondary | ICD-10-CM | POA: Diagnosis not present

## 2019-05-20 NOTE — Progress Notes (Signed)
Office Visit Note   Patient: Michael Morrison           Date of Birth: 1960/06/29           MRN: [id number] Visit Date: 05/20/2019              Requested by: Martinique, Betty G, [address] [place],  [place] [address] PCP: Martinique, Betty G, MD  Chief Complaint  Patient presents with  . Right Knee - Follow-up  . Lower Back - Follow-up      HPI: Patient is a 59 year old gentleman who presents in follow-up for osteoarthritis of the right knee he states the knee is asymptomatic at this time he states he still has lower back radicular symptoms to the left buttocks area.  Assessment & Plan: Visit Diagnoses:  1. Chronic left-sided low back pain with left-sided sciatica   2. Acute pain of right knee   3. History of left below knee amputation ([place])     Plan: We will have patient follow-up with Dr. Ernestina Patches for the stenosis at L4-5 with the cyst affecting the L5 nerve root on the left.  Patient states his knee is essentially asymptomatic at this time and we will follow this expectantly.  Discussed that we could proceed with injections if it is just pain without mechanical symptoms if he develops mechanical symptoms we could consider arthroscopic debridement.  Follow-Up Instructions: Return if symptoms worsen or fail to improve.   Ortho Exam  Patient is alert, oriented, no adenopathy, well-dressed, normal affect, normal respiratory effort. Examination patient ambulates with a cane he sits leaning to the right side to open up the foramen on the left.  He has no focal motor weakness on either lower extremity he does have crepitation with range of motion of the right knee but has no pain to palpation at this time.  Review of the MRI scan of the right knee shows a degenerative medial meniscal tear tricompartmental arthritic changes as well as stress reaction of the medial tibial plateau patient states he is essentially asymptomatic at this time.  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  Patient also has a prominent right renal pelvis which the MRI scan interprets as may be mild hydronephrosis.  I discussed with patient he states he did have hydronephrosis in the past and did have surgery for this patient states he is asymptomatic at this time.  Imaging: No results found. No images are attached to the encounter.  Labs: Lab Results  Component Value Date   ESRSEDRATE 87 (H) 05/22/2007   ESRSEDRATE 52 (H) 05/20/2007   LABURIC 4.1 05/20/2007   REPTSTATUS 04/17/2014 FINAL 04/14/2014   GRAMSTAIN  04/14/2014    MODERATE WBC PRESENT,BOTH PMN AND MONONUCLEAR NO SQUAMOUS EPITHELIAL CELLS SEEN FEW GRAM POSITIVE RODS RARE GRAM POSITIVE COCCI IN PAIRS    CULT  04/14/2014    MULTIPLE ORGANISMS PRESENT, NONE PREDOMINANT Note: NO STAPHYLOCOCCUS AUREUS ISOLATED NO GROUP A STREP (S.PYOGENES) ISOLATED Performed at [facility] 05/24/2007     Lab Results  Component Value Date   ALBUMIN 4.0 11/23/2010   ALBUMIN 3.8 10/25/2010   ALBUMIN 2.8 (L) 05/23/2007   LABURIC 4.1 05/20/2007    No results found for: MG No results found for: VD25OH  No results found for: PREALBUMIN  CBC EXTENDED Latest Ref Rng & Units 11/23/2014 02/03/2011 02/03/2011  WBC 4.0 - 10.5 K/uL 6.1 - -  RBC 4.22 - 5.81 MIL/uL 4.59 - -  HGB 13.0 - 17.0 g/dL 13.8 11.4(L) 10.9(L)  HCT 39.0 - 52.0 % 41.6 34.3(L) 33.2(L)  PLT 150 - 400 K/uL PLATELETS APPEAR ADEQUATE - -  NEUTROABS 1.7 - 7.7 K/uL 3.2 - -  LYMPHSABS 0.7 - 4.0 K/uL 2.4 - -     Body mass index is 26.45 kg/m.  Orders:  Orders Placed This Encounter  Procedures  . Ambulatory referral to Physical Medicine Rehab   No orders of the defined types were placed in this encounter.    Procedures: No procedures performed  Clinical Data: No additional findings.  ROS:   All other systems negative, except as noted in the HPI. Review of Systems  Objective: Vital Signs: Ht 6' (1.829 m)   Wt 195 lb (88.5 kg)   BMI 26.45 kg/m   Specialty Comments:  No specialty comments available.  PMFS History: Patient Active Problem List   Diagnosis Date Noted  . Tobacco use disorder 11/13/2018  . Gastroesophageal reflux disease 11/13/2018  . METHICILLIN RESISTANT STAPHYLOCOCCUS AUREUS INFECTION 07/03/2007  . CHRONIC OSTEOMYELITIS ANKLE AND FOOT 07/03/2007  . BKA, LEFT LEG 07/03/2007   Past Medical History:  Diagnosis Date  . 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 GERD (gastroesophageal reflux disease)   . Ureteral obstruction    right ureteropelvic junction obstruction    Family History  Problem Relation Age of Onset  . Diabetes Mother   . Arthritis Mother   . Cancer Mother   . Hyperlipidemia Mother   . Hypertension Mother   . Arthritis Sister   . Diabetes Sister   . Arthritis Brother   . Cancer Brother   . Diabetes Maternal Grandmother   . Diabetes Brother   . Drug abuse Brother     Past Surgical History:  Procedure Laterality Date  . ANKLE SURGERY  2005 & 2008   left ankle surgery--after fall of 40 feet  . CYSTOSCOPY W/ URETERAL STENT PLACEMENT  02/02/2011   Procedure: CYSTOSCOPY WITH RETROGRADE PYELOGRAM/URETERAL STENT PLACEMENT;  Surgeon: Dutch Gray, MD;  [facility];  Service: Urology;  Laterality: Right;  Cystoscopy Right Retrograde Right Ureteral Stent    (c-arm)   . I & D EXTREMITY Right 11/23/2014   Procedure: VY FLAP TO RIGHT MIDDLE FINGER;  Surgeon: Charlotte Crumb, MD;  Location: [facility];  Service: Orthopedics;  Laterality: Right;  . LEG AMPUTATION BELOW KNEE  2009   left below knee amputation -wears prosthesis  . ROBOT ASSISTED PYELOPLASTY  02/02/2011   Procedure: ROBOTIC ASSISTED PYELOPLASTY WITH STENT PLACEMENT;  Surgeon: Dutch Gray,  MD;  [facility];  Service: Urology;  Laterality: Right;  Cystoscopy Right Retrograde Right Ureteral Stent    (c-arm)   . tur bt  11/21/10   Social History   Occupational History  . Not on file  Tobacco Use  . Smoking status: Current Every Day Smoker    Packs/day: 0.50    Years: 20.00    Pack years: 10.00    Types: Cigarettes  . Smokeless tobacco: Never Used  Substance and Sexual Activity  . Alcohol use: Yes  . Drug use: No  . Sexual activity: Not on file

## 2019-05-20 NOTE — Telephone Encounter (Signed)
Do you want to refill? 

## 2019-05-21 ENCOUNTER — Other Ambulatory Visit: Payer: Self-pay | Admitting: Family

## 2019-05-21 ENCOUNTER — Telehealth: Payer: Self-pay | Admitting: Family

## 2019-05-21 MED ORDER — HYDROCODONE-ACETAMINOPHEN 5-325 MG PO TABS: 1.0000 | ORAL_TABLET | Freq: Three times a day (TID) | ORAL | 0 refills | Status: DC | PRN

## 2019-05-21 NOTE — Telephone Encounter (Signed)
This pt was in the office yesterday with an MRI L spine and MRI knee review. A referral for FN was placed ESI please see message below and call to discuss.

## 2019-05-21 NOTE — Telephone Encounter (Signed)
Called and spoke with patient.

## 2019-05-21 NOTE — Telephone Encounter (Signed)
Patient called requesting PA Dondra Prader give patient and his wife a call back. Patient was unclear of what was told to him about his MRI review. Wife also will be present when call from [facility] returns call. Patient phone number is [phone].

## 2019-05-29 ENCOUNTER — Other Ambulatory Visit: Payer: Self-pay | Admitting: Orthopedic Surgery

## 2019-05-29 ENCOUNTER — Telehealth: Payer: Self-pay | Admitting: Family

## 2019-05-29 MED ORDER — HYDROCODONE-ACETAMINOPHEN 5-325 MG PO TABS: 1.0000 | ORAL_TABLET | Freq: Three times a day (TID) | ORAL | 0 refills | Status: DC | PRN

## 2019-05-29 NOTE — Telephone Encounter (Signed)
Please advise 

## 2019-05-29 NOTE — Telephone Encounter (Signed)
I left voicemail for patient advising. 

## 2019-05-29 NOTE — Telephone Encounter (Signed)
Rx sent for vicodin

## 2019-06-04 ENCOUNTER — Telehealth: Payer: Self-pay | Admitting: *Deleted

## 2019-06-04 NOTE — Telephone Encounter (Signed)
Called pt and lvm #1 to advise per Junie Panning that pt needs PT for 6 weeks.

## 2019-06-05 ENCOUNTER — Telehealth: Payer: Self-pay | Admitting: *Deleted

## 2019-06-05 NOTE — Telephone Encounter (Signed)
Returned pt call and lvm #2

## 2019-06-09 ENCOUNTER — Telehealth: Payer: Self-pay | Admitting: *Deleted

## 2019-06-09 ENCOUNTER — Other Ambulatory Visit: Payer: Self-pay

## 2019-06-09 DIAGNOSIS — M5442 Lumbago with sciatica, left side: Secondary | ICD-10-CM

## 2019-06-09 DIAGNOSIS — G8929 Other chronic pain: Secondary | ICD-10-CM

## 2019-06-09 NOTE — Telephone Encounter (Signed)
Order in chart for physical therapy.

## 2019-06-10 ENCOUNTER — Ambulatory Visit (INDEPENDENT_AMBULATORY_CARE_PROVIDER_SITE_OTHER): Payer: 59 | Admitting: Physical Therapy

## 2019-06-10 ENCOUNTER — Other Ambulatory Visit: Payer: Self-pay

## 2019-06-10 ENCOUNTER — Encounter: Payer: Self-pay | Admitting: Physical Therapy

## 2019-06-10 DIAGNOSIS — G8929 Other chronic pain: Secondary | ICD-10-CM

## 2019-06-10 DIAGNOSIS — M5442 Lumbago with sciatica, left side: Secondary | ICD-10-CM

## 2019-06-10 NOTE — Patient Instructions (Signed)
Access Code: [email or web address] URL: [email or web address] Date: 06/10/2019 Prepared by: Elsie Ra  Exercises .Seated Lumbar Flexion Stretch - 2 x daily - 6 x weekly - 1 sets - 10-20 reps - 5 hold .Supine Hamstring Stretch with Strap - 2 x daily - 6 x weekly - 3 reps - 1 sets - 30 hold .Supine Piriformis Stretch with Foot on Ground - 2 x daily - 6 x weekly - 3 sets - 30 hold .Hooklying Single Knee to Chest Stretch - 2 x daily - 6 x weekly - 2 reps - 1 sets - 20 hold .Sit to Stand without Arm Support - 2 x daily - 6 x weekly - 10 reps - 1-2 sets .Supine Posterior Pelvic Tilt - 2 x daily - 6 x weekly - 10 reps - 2 sets - 5 hold .Standing Sidebends - 2 x daily - 6 x weekly - 10 reps - 1 sets

## 2019-06-10 NOTE — Therapy (Signed)
[facility] Physical Therapy [address] [place], [place], [address] Phone: [phone]   Fax:  [phone]  Physical Therapy Evaluation  Patient Details  Name: Michael Morrison MRN: [id number] Date of Birth: [birth year] Referring Provider (PT): Sharol Given, MD   Encounter Date: 06/10/2019  PT End of Session - 06/10/19 1201    Visit Number  1    Number of Visits  4    Date for PT Re-Evaluation  07/22/19    Authorization Type  [employer], UHC [facility]    PT Start Time  1020    PT Stop Time  1105    PT Time Calculation (min)  45 min    Activity Tolerance  Patient tolerated treatment well    Behavior During Therapy  [facility] for tasks assessed/performed       Past Medical History:  Diagnosis Date  . 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 GERD (gastroesophageal reflux disease)   . Ureteral obstruction    right ureteropelvic junction obstruction    Past Surgical History:  Procedure Laterality Date  . ANKLE SURGERY  2005 & 2008   left ankle surgery--after fall of 40 feet  . CYSTOSCOPY W/ URETERAL STENT PLACEMENT  02/02/2011   Procedure: CYSTOSCOPY WITH RETROGRADE PYELOGRAM/URETERAL STENT PLACEMENT;  Surgeon: Dutch Gray, MD;  Location: [facility];  Service: Urology;  Laterality: Right;  Cystoscopy Right Retrograde Right Ureteral Stent    (c-arm)   . I & D EXTREMITY Right 11/23/2014   Procedure: VY FLAP TO RIGHT MIDDLE FINGER;  Surgeon: Charlotte Crumb, MD;  Location: [place];  Service: Orthopedics;  Laterality: Right;  . LEG AMPUTATION BELOW KNEE  2009   left below knee amputation -wears prosthesis  . ROBOT ASSISTED PYELOPLASTY  02/02/2011   Procedure: ROBOTIC ASSISTED PYELOPLASTY WITH STENT PLACEMENT;  Surgeon: Dutch Gray, MD;  Location: [facility];  Service: Urology;  Laterality: Right;  Cystoscopy Right Retrograde Right Ureteral Stent    (c-arm)   . tur bt  11/21/10    There were no vitals  filed for this visit.   Subjective Assessment - 06/10/19 0939    Subjective  Chronic LBP with Lt sided sciatica, lumbar stenosis, pain since Feb 2021.    Pertinent History  PMH: left BKA,Rt knee OA and meniscal tear on MRI,    Limitations  Standing;Walking    How long can you sit comfortably?  not limited    How long can you stand comfortably?  less than 5 min    How long can you walk comfortably?  20 steps    Diagnostic tests  Lumbar MRI shows mod to severe stenosis L4-5 with facet cyst    Patient Stated Goals  get the pain down and learn what to do at home    Currently in Pain?  Yes    Pain Score  9    pain 2-3 sitting, 9 with standing/walking   Pain Orientation  Left    Pain Descriptors / Indicators  Aching;Sharp    Pain Type  Chronic pain    Pain Radiating Towards  down left leg half way down thigh    Pain Onset  More than a month ago    Pain Frequency  Intermittent    Aggravating Factors   standing, walking    Pain Relieving Factors  ice, rest         [facility] PT Assessment - 06/10/19 0001  Assessment   Medical Diagnosis  Chronic LBP with Lt sided sciatica, lumbar stenosis    Referring Provider (PT)  Sharol Given, MD    Onset Date/Surgical Date  --   2 month onset of pain   Next MD Visit  to follow up for injection with Dr. Ernestina Patches if PT does not help    Prior Therapy  none      Precautions   Precautions  None      Restrictions   Weight Bearing Restrictions  No      Balance Screen   Has the patient fallen in the past 6 months  No      [place] residence      Prior Function   Level of Independence  Independent      Cognition   Overall Cognitive Status  Within Functional Limits for tasks assessed      Sensation   Light Touch  Appears Intact      Coordination   Gross Motor Movements are Fluid and Coordinated  Yes      ROM / Strength   AROM / PROM / Strength  AROM;Strength      AROM   AROM Assessment Site  Lumbar     Lumbar Flexion  WFL    Lumbar Extension  25%    Lumbar - Right Side Bend  WFL    Lumbar - Left Side Bend  50%    Lumbar - Right Rotation  50%    Lumbar - Left Rotation  50%      Strength   Overall Strength Comments  hip strength overall 5/5 MMT bilat, Rt knee strength 5/5, Lt knee strength 4/5 MMT all tested in sitting      Flexibility   Soft Tissue Assessment /Muscle Length  --   tight glutes, lumbar P.S, hamstrings     Special Tests   Other special tests  some relief with repeated flexion stretching, + SLR test bilat, + slump test      Transfers   Transfers  Independent with all Transfers      Ambulation/Gait   Gait Comments  antalgic gait on Lt, does have BKA with prosthesis                Objective measurements completed on examination: See above findings.      [facility] Adult PT Treatment/Exercise - 06/10/19 0001      Exercises   Exercises  Lumbar      Lumbar Exercises: Stretches   Single Knee to Chest Stretch  Right;Left;1 rep;30 seconds    Piriformis Stretch  Left;2 reps;30 seconds    Other Lumbar Stretch Exercise  seated lumbar flexion stretch fwd then diagonal to Rt 5 sec X 10 ea, standing lumbar sidebending to Rt 5 sec X 10      Modalities   Modalities  Cryotherapy      Cryotherapy   Number Minutes Cryotherapy  10 Minutes    Cryotherapy Location  Lumbar Spine    Type of Cryotherapy  Ice pack   in hooklying with feet elevated                 PT Long Term Goals - 06/10/19 1216      PT LONG TERM GOAL #1   Title  Pt will be I and compliant with HEP. (target for all goals 6 weeks)    Status  New    Target Date  07/22/19  PT LONG TERM GOAL #2   Title  Pt will improve lumbar ROM to WNL with less than 1-2/10 pain.    Status  New      PT LONG TERM GOAL #3   Title  Pt will improve pain with standing/walking to >30 minutes with less than 3/10 pain.    Status  New             Plan - 06/10/19 1202    Clinical Impression  Statement  Pt presents with Chronic LBP with Lt sided sciatica, mod to severe lumbar stenosis confirmed on MRI. He has overall decreased back ROM, decreased Lt leg strength, radicular symptoms in Lt leg, and increasd pain with standing, walking, stairs. He will benefit from skilled PT to address his deficits.    Personal Factors and Comorbidities  Comorbidity 2    Comorbidities  PMH: left BKA,Rt knee OA and meniscal tear on MRI,    Examination-Activity Limitations  Stairs;Stand;Lift;Carry;Sleep;Squat;Locomotion Level    Examination-Participation Restrictions  Cleaning;Driving;Shop;Yard Work    Stability/Clinical Decision Making  Evolving/Moderate complexity    Clinical Decision Making  Moderate    Rehab Potential  Good    PT Frequency  1x / week    PT Duration  6 weeks    PT Treatment/Interventions  ADLs/Self Care Home Management;Cryotherapy;Electrical Stimulation;Moist Heat;Traction;Ultrasound;Gait training;Therapeutic exercise;Therapeutic activities;Neuromuscular re-education;Manual techniques;Passive range of motion;Dry needling;Spinal Manipulations;Joint Manipulations;Taping    PT Next Visit Plan  appeared to benefit from flexion based program due to stenosis. Consider traction next visit or manual therapy    PT Home Exercise Plan  Access Code: [email or web address]    Consulted and Agree with Plan of Care  Patient       Patient will benefit from skilled therapeutic intervention in order to improve the following deficits and impairments:  Abnormal gait, Decreased activity tolerance, Decreased strength, Decreased range of motion, Difficulty walking, Postural dysfunction, Pain, Increased fascial restricitons, Increased muscle spasms, Impaired flexibility  Visit Diagnosis: Chronic left-sided low back pain with left-sided sciatica     Problem List Patient Active Problem List   Diagnosis Date Noted  . Tobacco use disorder 11/13/2018  . Gastroesophageal reflux disease 11/13/2018  . METHICILLIN  RESISTANT STAPHYLOCOCCUS AUREUS INFECTION 07/03/2007  . CHRONIC OSTEOMYELITIS ANKLE AND FOOT 07/03/2007  . BKA, LEFT LEG 07/03/2007    Silvestre Mesi 06/10/2019, 12:18 PM  [facility] Physical Therapy [address] [place], [place], [address] Phone: [phone]   Fax:  [phone]  Name: Michael Morrison MRN: [id number] Date of Birth: 1960-07-02

## 2019-06-17 ENCOUNTER — Ambulatory Visit: Payer: 59 | Attending: Family

## 2019-06-17 ENCOUNTER — Other Ambulatory Visit: Payer: Self-pay | Admitting: Family

## 2019-06-17 DIAGNOSIS — Z23 Encounter for immunization: Secondary | ICD-10-CM

## 2019-06-17 NOTE — Progress Notes (Signed)
   Covid-19 Vaccination Clinic  Name:  Michael Morrison    MRN: [id number] DOB: 11/28/1960  06/17/2019  Michael Morrison was observed post Covid-19 immunization for 15 minutes without incident. He was provided with Vaccine Information Sheet and instruction to access the V-Safe system.   Michael Morrison was instructed to call 911 with any severe reactions post vaccine: [place] Kitchen Difficulty breathing  . Swelling of face and throat  . A fast heartbeat  . A bad rash all over body  . Dizziness and weakness   Immunizations Administered    Name Date Dose VIS Date Route   Moderna COVID-19 Vaccine 06/17/2019 12:47 PM 0.5 mL 01/2019 Intramuscular   Manufacturer: Moderna   Lot: [id number]   [id number]

## 2019-06-18 ENCOUNTER — Other Ambulatory Visit: Payer: Self-pay

## 2019-06-18 ENCOUNTER — Ambulatory Visit (INDEPENDENT_AMBULATORY_CARE_PROVIDER_SITE_OTHER): Payer: 59 | Admitting: Physical Therapy

## 2019-06-18 DIAGNOSIS — M5442 Lumbago with sciatica, left side: Secondary | ICD-10-CM | POA: Diagnosis not present

## 2019-06-18 DIAGNOSIS — G8929 Other chronic pain: Secondary | ICD-10-CM

## 2019-06-18 NOTE — Therapy (Signed)
[facility] Physical Therapy [address] [place], [place], [address] Phone: [phone]   Fax:  [phone]  Physical Therapy Treatment  Patient Details  Name: Michael Morrison MRN: [id number] Date of Birth: 05-Jan-1961 Referring Provider (PT): Sharol Given, MD   Encounter Date: 06/18/2019  PT End of Session - 06/18/19 1432    Visit Number  2    Number of Visits  4    Date for PT Re-Evaluation  07/22/19    Authorization Type  Aetna, UHC [facility]    PT Start Time  1315    PT Stop Time  1400    PT Time Calculation (min)  45 min    Activity Tolerance  Patient tolerated treatment well    Behavior During Therapy  [facility] for tasks assessed/performed       Past Medical History:  Diagnosis Date  . 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 GERD (gastroesophageal reflux disease)   . Ureteral obstruction    right ureteropelvic junction obstruction    Past Surgical History:  Procedure Laterality Date  . ANKLE SURGERY  2005 & 2008   left ankle surgery--after fall of 40 feet  . CYSTOSCOPY W/ URETERAL STENT PLACEMENT  02/02/2011   Procedure: CYSTOSCOPY WITH RETROGRADE PYELOGRAM/URETERAL STENT PLACEMENT;  Surgeon: Dutch Gray, MD;  [facility];  Service: Urology;  Laterality: Right;  Cystoscopy Right Retrograde Right Ureteral Stent    (c-arm)   . I & D EXTREMITY Right 11/23/2014   Procedure: VY FLAP TO RIGHT MIDDLE FINGER;  Surgeon: Charlotte Crumb, MD;  Location: [place];  Service: Orthopedics;  Laterality: Right;  . LEG AMPUTATION BELOW KNEE  2009   left below knee amputation -wears prosthesis  . ROBOT ASSISTED PYELOPLASTY  02/02/2011   Procedure: ROBOTIC ASSISTED PYELOPLASTY WITH STENT PLACEMENT;  Surgeon: Dutch Gray, MD;  [facility];  Service: Urology;  Laterality: Right;  Cystoscopy Right Retrograde Right Ureteral Stent    (c-arm)   . tur bt  11/21/10    There were no vitals filed  for this visit.  Subjective Assessment - 06/18/19 1323    Subjective  Relays the stretches are really helping has noticed a difference, still gets some back pain with increased activity at work    Pertinent History  PMH: left BKA,Rt knee OA and meniscal tear on MRI,    Limitations  Standing;Walking    How long can you sit comfortably?  not limited    How long can you stand comfortably?  less than 5 min    How long can you walk comfortably?  20 steps    Diagnostic tests  Lumbar MRI shows mod to severe stenosis L4-5 with facet cyst    Patient Stated Goals  get the pain down and learn what to do at home    Pain Onset  More than a month ago                       [facility] Adult PT Treatment/Exercise - 06/18/19 0001      Lumbar Exercises: Stretches   Piriformis Stretch  Left;3 reps;30 seconds    Other Lumbar Stretch Exercise  seated lumbar flexion stretch fwd then diagonal to Rt 10 sec X 5 ea      Lumbar Exercises: Aerobic   Recumbent Bike  5 min L2      Lumbar Exercises: Machines for Strengthening  Leg Press  100 lbs bilat push 2X15, then Lt leg only 62 lbs 2X15, then dropped down to 50 lbs 2X15      Lumbar Exercises: Standing   Other Standing Lumbar Exercises  lateral walking red band around feet 30 ft  X2 reps up/down      Lumbar Exercises: Seated   Sit to Stand  10 reps    Sit to Stand Limitations  no UE, slow eccentric lower      Modalities   Modalities  --   declined            PT Education - 06/18/19 1406    Education Details  added lateral walking to HEP    Person(s) Educated  Patient    Comprehension  Verbalized understanding;Returned demonstration          PT Long Term Goals - 06/10/19 1216      PT LONG TERM GOAL #1   Title  Pt will be I and compliant with HEP. (target for all goals 6 weeks)    Status  New    Target Date  07/22/19      PT LONG TERM GOAL #2   Title  Pt will improve lumbar ROM to WNL with less than 1-2/10 pain.    Status   New      PT LONG TERM GOAL #3   Title  Pt will improve pain with standing/walking to >30 minutes with less than 3/10 pain.    Status  New            Plan - 06/18/19 1342    Clinical Impression Statement  He has been compliant with HEP and overall this appears to be helping with overall pain levels. His leg strengthening program was progressed today with good tolerance. Continue POC    Personal Factors and Comorbidities  Comorbidity 2    Comorbidities  PMH: left BKA,Rt knee OA and meniscal tear on MRI,    Examination-Activity Limitations  Stairs;Stand;Lift;Carry;Sleep;Squat;Locomotion Level    Examination-Participation Restrictions  Cleaning;Driving;Shop;Yard Work    [employer]  Evolving/Moderate complexity    Rehab Potential  Good    PT Frequency  1x / week    PT Duration  6 weeks    PT Treatment/Interventions  ADLs/Self Care Home Management;Cryotherapy;Electrical Stimulation;Moist Heat;Traction;Ultrasound;Gait training;Therapeutic exercise;Therapeutic activities;Neuromuscular re-education;Manual techniques;Passive range of motion;Dry needling;Spinal Manipulations;Joint Manipulations;Taping    PT Next Visit Plan  appeared to benefit from flexion based program due to stenosis. Consider traction next visit or manual therapy    PT Home Exercise Plan  Access Code: [email or web address]    Consulted and Agree with Plan of Care  Patient       Patient will benefit from skilled therapeutic intervention in order to improve the following deficits and impairments:  Abnormal gait, Decreased activity tolerance, Decreased strength, Decreased range of motion, Difficulty walking, Postural dysfunction, Pain, Increased fascial restricitons, Increased muscle spasms, Impaired flexibility  Visit Diagnosis: Chronic left-sided low back pain with left-sided sciatica     Problem List Patient Active Problem List   Diagnosis Date Noted  . Tobacco use disorder 11/13/2018  . Gastroesophageal  reflux disease 11/13/2018  . METHICILLIN RESISTANT STAPHYLOCOCCUS AUREUS INFECTION 07/03/2007  . CHRONIC OSTEOMYELITIS ANKLE AND FOOT 07/03/2007  . BKA, LEFT LEG 07/03/2007    Silvestre Mesi 06/18/2019, 2:34 PM  [facility] Physical Therapy [address] [place], [place], [address] Phone: [phone]   Fax:  [phone]  Name: Michael Morrison MRN: [id number] Date of Birth:  01/08/1961   

## 2019-06-23 ENCOUNTER — Encounter: Payer: 59 | Admitting: Physical Therapy

## 2019-06-27 ENCOUNTER — Encounter: Payer: 59 | Admitting: Physical Therapy

## 2019-07-02 ENCOUNTER — Ambulatory Visit (INDEPENDENT_AMBULATORY_CARE_PROVIDER_SITE_OTHER): Payer: 59 | Admitting: Physical Therapy

## 2019-07-02 ENCOUNTER — Encounter: Payer: Self-pay | Admitting: Physical Therapy

## 2019-07-02 ENCOUNTER — Other Ambulatory Visit: Payer: Self-pay

## 2019-07-02 DIAGNOSIS — G8929 Other chronic pain: Secondary | ICD-10-CM

## 2019-07-02 DIAGNOSIS — M5442 Lumbago with sciatica, left side: Secondary | ICD-10-CM | POA: Diagnosis not present

## 2019-07-02 NOTE — Therapy (Addendum)
[facility] Physical Therapy [address] [place], [place], [address] Phone: [phone]   Fax:  [phone]  Physical Therapy Treatment Discharge   Patient Details  Name: Michael Morrison MRN: [id number] Date of Birth: Jul 03, 1960 Referring Provider (PT): Sharol Given, MD   Encounter Date: 07/02/2019  PT End of Session - 07/02/19 1328    Visit Number  3    Number of Visits  4    Date for PT Re-Evaluation  07/22/19    Authorization Type  [place]    PT Start Time  1257    PT Stop Time  1338    PT Time Calculation (min)  41 min    Activity Tolerance  Patient tolerated treatment well    Behavior During Therapy  [facility] for tasks assessed/performed       Past Medical History:  Diagnosis Date  . 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 GERD (gastroesophageal reflux disease)   . Ureteral obstruction    right ureteropelvic junction obstruction    Past Surgical History:  Procedure Laterality Date  . ANKLE SURGERY  2005 & 2008   left ankle surgery--after fall of 40 feet  . CYSTOSCOPY W/ URETERAL STENT PLACEMENT  02/02/2011   Procedure: CYSTOSCOPY WITH RETROGRADE PYELOGRAM/URETERAL STENT PLACEMENT;  Surgeon: Dutch Gray, MD;  [facility];  Service: Urology;  Laterality: Right;  Cystoscopy Right Retrograde Right Ureteral Stent    (c-arm)   . I & D EXTREMITY Right 11/23/2014   Procedure: VY FLAP TO RIGHT MIDDLE FINGER;  Surgeon: Charlotte Crumb, MD;  Location: [place];  Service: Orthopedics;  Laterality: Right;  . LEG AMPUTATION BELOW KNEE  2009   left below knee amputation -wears prosthesis  . ROBOT ASSISTED PYELOPLASTY  02/02/2011   Procedure: ROBOTIC ASSISTED PYELOPLASTY WITH STENT PLACEMENT;  Surgeon: Dutch Gray, MD;  [facility];  Service: Urology;  Laterality: Right;  Cystoscopy Right Retrograde Right Ureteral Stent    (c-arm)   . tur bt  11/21/10    There were no  vitals filed for this visit.  Subjective Assessment - 07/02/19 1303    Subjective  Michael Morrison is reporting that therapy has really helped his back. Pt reporting he had increased his work activities.    Pertinent History  PMH: left BKA,Rt knee OA and meniscal tear on MRI,    Limitations  Standing;Walking    How long can you sit comfortably?  not limited    How long can you stand comfortably?  less than 5 min    How long can you walk comfortably?  20 steps    Diagnostic tests  Lumbar MRI shows mod to severe stenosis L4-5 with facet cyst    Patient Stated Goals  get the pain down and learn what to do at home    Pain Score  1     Pain Orientation  Mid    Pain Descriptors / Indicators  Aching    Pain Type  Chronic pain    Pain Onset  More than a month ago                       [facility] Adult PT Treatment/Exercise - 07/02/19 0001      Lumbar Exercises: Stretches   Piriformis Stretch  Right;Left;2 reps;30 seconds      Lumbar Exercises: Aerobic   Recumbent Bike  L3 x  6 minutes      Lumbar Exercises: Machines for Strengthening   Leg Press  125# bilateral LE's 2 x 20 reps      Lumbar Exercises: Seated   Sit to Stand  10 reps      Lumbar Exercises: Supine   Bridge with Ball Squeeze  10 reps;5 seconds                  PT Long Term Goals - 07/02/19 1332      PT LONG TERM GOAL #1   Title  Pt will be I and compliant with HEP. (target for all goals 6 weeks)    Status  On-going      PT LONG TERM GOAL #2   Title  Pt will improve lumbar ROM to WNL with less than 1-2/10 pain.    Status  On-going      PT LONG TERM GOAL #3   Title  Pt will improve pain with standing/walking to >30 minutes with less than 3/10 pain.    Status  On-going            Plan - 07/02/19 1337    Clinical Impression Statement  Pt arriving to therapy reporting 1/10 pain in his low back. Pt pleased with progress so far. Pt is compliant with his HEP. Pt was issued a green and blue  theraband this visit to progress his HEP. Pt has one more visit scheduled with primary therapist before discharge.    Personal Factors and Comorbidities  Comorbidity 2    Comorbidities  PMH: left BKA,Rt knee OA and meniscal tear on MRI,    Examination-Activity Limitations  Stairs;Stand;Lift;Carry;Sleep;Squat;Locomotion Level    Examination-Participation Restrictions  Cleaning;Driving;Shop;Yard Work    [employer] officer  Evolving/Moderate complexity    Rehab Potential  Good    PT Frequency  1x / week    PT Duration  6 weeks    PT Treatment/Interventions  ADLs/Self Care Home Management;Cryotherapy;Electrical Stimulation;Moist Heat;Traction;Ultrasound;Gait training;Therapeutic exercise;Therapeutic activities;Neuromuscular re-education;Manual techniques;Passive range of motion;Dry needling;Spinal Manipulations;Joint Manipulations;Taping    PT Next Visit Plan  appeared to benefit from flexion based program due to stenosis. Consider traction next visit or manual therapy    PT Home Exercise Plan  Access Code: [email or web address]    Consulted and Agree with Plan of Care  Patient       Patient will benefit from skilled therapeutic intervention in order to improve the following deficits and impairments:  Abnormal gait, Decreased activity tolerance, Decreased strength, Decreased range of motion, Difficulty walking, Postural dysfunction, Pain, Increased fascial restricitons, Increased muscle spasms, Impaired flexibility  Visit Diagnosis: Chronic left-sided low back pain with left-sided sciatica    PHYSICAL THERAPY DISCHARGE SUMMARY  Visits from Start of Care: 3 of 4 visits attended  Current functional level related to goals / functional outcomes: See above   Remaining deficits: See above  Education / Equipment: HEP Plan: Patient agrees to discharge.  Patient goals were not met. Patient is being discharged due to not returning since the last visit.  ?????       Problem List Patient  Active Problem List   Diagnosis Date Noted  . Tobacco use disorder 11/13/2018  . Gastroesophageal reflux disease 11/13/2018  . METHICILLIN RESISTANT STAPHYLOCOCCUS AUREUS INFECTION 07/03/2007  . CHRONIC OSTEOMYELITIS ANKLE AND FOOT 07/03/2007  . BKA, LEFT LEG 07/03/2007    Oretha Caprice, PT, MPT 07/02/2019, 1:40 PM  [facility] Physical Therapy [address] [place], [place], [address] Phone: [phone]  Fax:  [phone]  Name: Michael Morrison MRN: [id number] Date of Birth: 1960-09-01

## 2019-07-10 ENCOUNTER — Other Ambulatory Visit: Payer: Self-pay

## 2019-07-10 ENCOUNTER — Telehealth: Payer: Self-pay | Admitting: Physical Therapy

## 2019-07-10 ENCOUNTER — Encounter: Payer: 59 | Admitting: Physical Therapy

## 2019-07-10 NOTE — Telephone Encounter (Signed)
I called pt to follow up about his missed PT appointment on 07/10/2019. I left a message and out clinic number if he needed to reschedule or cancel any future appointments.   Kearney Hard, PT, MPT 07/10/19 10:07 AM

## 2019-11-17 ENCOUNTER — Ambulatory Visit: Payer: 59 | Admitting: Orthopedic Surgery

## 2019-12-04 ENCOUNTER — Other Ambulatory Visit: Payer: Self-pay | Admitting: Family Medicine

## 2019-12-04 DIAGNOSIS — R1310 Dysphagia, unspecified: Secondary | ICD-10-CM

## 2019-12-04 DIAGNOSIS — K219 Gastro-esophageal reflux disease without esophagitis: Secondary | ICD-10-CM

## 2020-03-22 ENCOUNTER — Ambulatory Visit: Payer: Managed Care, Other (non HMO) | Admitting: Podiatry

## 2020-03-27 ENCOUNTER — Other Ambulatory Visit: Payer: Self-pay | Admitting: Family Medicine

## 2020-03-27 DIAGNOSIS — K219 Gastro-esophageal reflux disease without esophagitis: Secondary | ICD-10-CM

## 2020-03-27 DIAGNOSIS — R1310 Dysphagia, unspecified: Secondary | ICD-10-CM

## 2020-03-29 ENCOUNTER — Ambulatory Visit: Payer: Managed Care, Other (non HMO) | Admitting: Podiatry

## 2020-04-09 ENCOUNTER — Ambulatory Visit: Payer: Managed Care, Other (non HMO) | Admitting: Family Medicine

## 2020-04-12 ENCOUNTER — Ambulatory Visit: Payer: Managed Care, Other (non HMO) | Admitting: Family Medicine

## 2020-06-07 ENCOUNTER — Other Ambulatory Visit: Payer: Self-pay | Admitting: Orthopedic Surgery

## 2020-11-08 ENCOUNTER — Telehealth (INDEPENDENT_AMBULATORY_CARE_PROVIDER_SITE_OTHER): Payer: Managed Care, Other (non HMO) | Admitting: Family Medicine

## 2020-11-08 ENCOUNTER — Encounter: Payer: Self-pay | Admitting: Family Medicine

## 2020-11-08 VITALS — Ht 72.0 in

## 2020-11-08 DIAGNOSIS — U071 COVID-19: Secondary | ICD-10-CM

## 2020-11-08 MED ORDER — MOLNUPIRAVIR EUA 200MG CAPSULE: 4.0000 | ORAL_CAPSULE | Freq: Two times a day (BID) | ORAL | 0 refills | Status: AC

## 2020-11-08 NOTE — Progress Notes (Signed)
Virtual Visit via Video Note I connected with Michael Morrison on 11/08/20 by a video enabled telemedicine application and verified that I am speaking with the correct person using two identifiers.  Location patient: home Location provider:work office Persons participating in the virtual visit: patient, provider  I discussed the limitations of evaluation and management by telemedicine and the availability of in person appointments. The patient expressed understanding and agreed to proceed.  Chief Complaint  Patient presents with   Covid Positive    Symptoms started Friday evening, tested positive yesterday, 9/11.    Cough   Fatigue   HPI: Michael Morrison is a 60 yo male with hx of GERD and tobacco use c/o some respiratory symptom and positive home COVID 19 test as described above. 3 days ago he started with fatigue,non productive cough, and sneezing. 2 days ago he started with body aches,chills,rhinorrhea, and sore throat.  Negative for headache,fever,anosmia,ageusia,wheezing,SOB,CP<palpitation,abdominal pain,N/V,changes in bowel movement, urinary symptoms,or skin rash.  OTC treatments: Advil and Tylenol. Cough does not interfere with sleep. Symptoms are otherwise stable.  No known sick contact or recent travel. COVID 19 vaccination completed. No known hx of HTN,CKD,DM. He was last seen on 11/13/18.  ROS: See pertinent positives and negatives per HPI.  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   Past Surgical History:  Procedure Laterality Date   ANKLE SURGERY  2005 & 2008   left ankle surgery--after fall of 40 feet   CYSTOSCOPY W/ URETERAL STENT PLACEMENT  02/02/2011   Procedure: CYSTOSCOPY WITH RETROGRADE PYELOGRAM/URETERAL STENT PLACEMENT;   Surgeon: Dutch Gray, MD;  Location: [facility];  Service: Urology;  Laterality: Right;  Cystoscopy Right Retrograde Right Ureteral Stent    (c-arm)    I & D EXTREMITY Right 11/23/2014   Procedure: VY FLAP TO RIGHT MIDDLE FINGER;  Surgeon: Charlotte Crumb, MD;  Location: [facility];  Service: Orthopedics;  Laterality: Right;   LEG AMPUTATION BELOW KNEE  2009   left below knee amputation -wears prosthesis   ROBOT ASSISTED PYELOPLASTY  02/02/2011   Procedure: ROBOTIC ASSISTED PYELOPLASTY WITH STENT PLACEMENT;  Surgeon: Dutch Gray, MD;  Location: [facility];  Service: Urology;  Laterality: Right;  Cystoscopy Right Retrograde Right Ureteral Stent    (c-arm)    tur bt  11/21/10   Family History  Problem Relation Age of Onset   Diabetes Mother    Arthritis Mother    Cancer Mother    Hyperlipidemia Mother    Hypertension Mother    Arthritis Sister    Diabetes Sister    Arthritis Brother    Cancer Brother    Diabetes Maternal Grandmother    Diabetes Brother    Drug abuse Brother     Social History   Socioeconomic History   Marital status: Divorced    Spouse name: Not on file   Number of children: Not on file   Years of education: Not on file   Highest education level: Not on file  Occupational History   Not on file  Tobacco Use   Smoking status: Every Day    Packs/day: 0.50    Years: 20.00    Pack years: 10.00    Types: Cigarettes   Smokeless tobacco: Never  Vaping Use   Vaping Use:  Not on file  Substance and Sexual Activity   Alcohol use: Yes   Drug use: No   Sexual activity: Not on file  Other Topics Concern   Not on file  Social History Narrative   Not on file   Social Determinants of Health   Financial Resource Strain: Not on file  Food Insecurity: Not on file  Transportation Needs: Not on file  Physical Activity: Not on file  Stress: Not on file  Social Connections: Not on file  Intimate Partner Violence: Not on file   Current Outpatient Medications:    naproxen  (NAPROSYN) 500 MG tablet, TAKE 1 TABLET(500 MG) BY MOUTH TWICE DAILY WITH MEALS AS NEEDED FOR MILD PAIN OR MODERATE PAIN, Disp: 60 tablet, Rfl: 0   omeprazole (PRILOSEC) 40 MG capsule, TAKE 1 CAPSULE(40 MG) BY MOUTH DAILY, Disp: 90 capsule, Rfl: 0  EXAM:  VITALS per patient if applicable:Ht 6' (0000000 m)   BMI 26.45 kg/m   GENERAL: alert, oriented, appears well and in no acute distress  HEENT: atraumatic, conjunctiva clear, no obvious abnormalities on inspection of external nose and ears  NECK: normal movements of the head and neck  LUNGS: on inspection no signs of respiratory distress, breathing rate appears normal, no obvious gross SOB, gasping or wheezing  CV: no obvious cyanosis  MS: moves all visible extremities without noticeable abnormality  PSYCH/NEURO: pleasant and cooperative, no obvious depression or anxiety, speech and thought processing grossly intact  ASSESSMENT AND PLAN:  Discussed the following assessment and plan:  COVID-19 virus infection - Plan: molnupiravir EUA 200 mg CAPS  No problem-specific Assessment & Plan notes found for this encounter.  We discussed Dx,possible complications and treatment options. He has a mild case with risk for complications due to age and tobacco use. We discussed oral antiviral options and side effects.  Because we do not have recent blood work to evaluate renal function, he agrees with trying Molnupiravir..  Symptomatic treatment with plenty of fluids,rest,tylenol 500 mg 3-4 times per day prn. Avoid NSAID's. Smocking cessation encouraged.  Throat lozenges if needed for sore throat. Explained that cough and congestion may last a few more days and even weeks after acute symptoms have resolved. Clearly instructed about warning signs.  We discussed possible serious and likely etiologies, options for evaluation and workup, limitations of telemedicine visit vs in person visit, treatment, treatment risks and precautions. He was  advised to call back or seek an in-person evaluation if the symptoms worsen or if the condition fails to improve as anticipated. I discussed the assessment and treatment plan with the patient. Michael Morrison was provided an opportunity to ask questions and all were answered. He agreed with the plan and demonstrated an understanding of the instructions.  Return if symptoms worsen or fail to improve.  Donivin Wirt Martinique, MD

## 2020-11-08 NOTE — Progress Notes (Deleted)
      HPI:  Mr.Michael Morrison is a 60 y.o. male, who is here today for *** months follow up.   *** was last seen on ***  {4+ HPI elements (or status of 3 or more chronic diseases)} ***   Review of Systems Rest of ROS, see pertinent positives sand negatives in HPI [review of 2 to 9 systems] ***  Current Outpatient Medications on File Prior to Visit  Medication Sig Dispense Refill   naproxen (NAPROSYN) 500 MG tablet TAKE 1 TABLET(500 MG) BY MOUTH TWICE DAILY WITH MEALS AS NEEDED FOR MILD PAIN OR MODERATE PAIN 60 tablet 0   omeprazole (PRILOSEC) 40 MG capsule TAKE 1 CAPSULE(40 MG) BY MOUTH DAILY 90 capsule 0   No current facility-administered medications on file prior to visit.     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   Allergies  Allergen Reactions   Other     Social History   Socioeconomic History   Marital status: Divorced    Spouse name: Not on file   Number of children: Not on file   Years of education: Not on file   Highest education level: Not on file  Occupational History   Not on file  Tobacco Use   Smoking status: Every Day    Packs/day: 0.50    Years: 20.00    Pack years: 10.00    Types: Cigarettes   Smokeless tobacco: Never  Vaping Use   Vaping Use: Not on file  Substance and Sexual Activity   Alcohol use: Yes   Drug use: No   Sexual activity: Not on file  Other Topics Concern   Not on file  Social History Narrative   Not on file   Social Determinants of Health   Financial Resource Strain: Not on file  Food Insecurity: Not on file  Transportation Needs: Not on file  Physical Activity: Not on file  Stress: Not on file  Social Connections: Not on file    There were no vitals filed for this  visit. Body mass index is 26.45 kg/m.   Physical Exam  {[12+ exam elements]} ***  ASSESSMENT AND PLAN:  Mr. Michael Morrison was seen today for *** months follow-up.  No orders of the defined types were placed in this encounter.   No problem-specific Assessment & Plan notes found for this encounter.    No follow-ups on file.   Derril Franek G. Martinique, MD  [facility]. [place] office.

## 2020-11-11 ENCOUNTER — Encounter: Payer: Self-pay | Admitting: Family Medicine

## 2021-02-01 ENCOUNTER — Ambulatory Visit (INDEPENDENT_AMBULATORY_CARE_PROVIDER_SITE_OTHER): Payer: Managed Care, Other (non HMO) | Admitting: Orthopedic Surgery

## 2021-02-01 ENCOUNTER — Encounter: Payer: Self-pay | Admitting: Orthopedic Surgery

## 2021-02-01 DIAGNOSIS — Z89512 Acquired absence of left leg below knee: Secondary | ICD-10-CM

## 2021-02-01 DIAGNOSIS — M5442 Lumbago with sciatica, left side: Secondary | ICD-10-CM | POA: Diagnosis not present

## 2021-02-01 DIAGNOSIS — G8929 Other chronic pain: Secondary | ICD-10-CM | POA: Diagnosis not present

## 2021-02-01 NOTE — Progress Notes (Signed)
Office Visit Note   Patient: Michael Morrison           Date of Birth: Aug 01, 1960           MRN: [id number] Visit Date: 02/01/2021              Requested by: Martinique, Betty G, MD [address] [place],  [place] [address] PCP: Martinique, Betty G, MD  Chief Complaint  Patient presents with   Left Leg - Follow-up    Hx Left BKA      HPI: Patient is a 60 year old gentleman who presents with 2 separate issues.  #1 he is having recurrent radicular symptoms from his lumbar spine now down the right side.  Patient states he had good relief with previous therapy.  Patient has been having increasing pain numbness and burning with an bearing pain in his socket on the left transtibial amputation.  Patient also states that the socket is broken and cracked.  Assessment & Plan: Visit Diagnoses:  1. Chronic left-sided low back pain with left-sided sciatica   2. History of left below knee amputation [facility])     Plan: Patient is given a prescription for [employer] for a new socket liner materials and supplies.  A orders placed for physical therapy for his sciatic symptoms.  Follow-Up Instructions: Return if symptoms worsen or fail to improve.   Ortho Exam  Patient is alert, oriented, no adenopathy, well-dressed, normal affect, normal respiratory effort. Examination patient has a negative straight leg raise with no focal motor weakness in his lower extremities he has pain from the lumbar spine into the right hip.  Examination of the left leg there is no ulcers there is decreased residual volume in the residual limb.  The socket is broken the liner is torn.  Patient is currently in bearing into his socket and is at risk of skin breakdown and ulceration.  Patient is an existing left transtibial  amputee.  Patient's current comorbidities are not expected to impact the ability to function with the prescribed prosthesis. Patient verbally communicates a strong desire to use a prosthesis. Patient currently  requires mobility aids to ambulate without a prosthesis.  Expects not to use mobility aids with a new prosthesis.  Patient is a K3 level ambulator that spends a lot of time walking around on uneven terrain over obstacles, up and down stairs, and ambulates with a variable cadence.     Imaging: No results found. No images are attached to the encounter.  Labs: Lab Results  Component Value Date   ESRSEDRATE 87 (H) 05/22/2007   ESRSEDRATE 52 (H) 05/20/2007   LABURIC 4.1 05/20/2007   REPTSTATUS 04/17/2014 FINAL 04/14/2014   GRAMSTAIN  04/14/2014    MODERATE WBC PRESENT,BOTH PMN AND MONONUCLEAR NO SQUAMOUS EPITHELIAL CELLS SEEN FEW GRAM POSITIVE RODS RARE GRAM POSITIVE COCCI IN PAIRS    CULT  04/14/2014    MULTIPLE ORGANISMS PRESENT, NONE PREDOMINANT Note: NO STAPHYLOCOCCUS AUREUS ISOLATED NO GROUP A STREP (S.PYOGENES) ISOLATED Performed at [facility] 05/24/2007     Lab Results  Component Value Date   ALBUMIN 4.0 11/23/2010   ALBUMIN 3.8 10/25/2010   ALBUMIN 2.8 (L) 05/23/2007    No results found for: MG No results found for: VD25OH  No results found for: PREALBUMIN CBC EXTENDED Latest Ref Rng & Units 11/23/2014 02/03/2011 02/03/2011  WBC 4.0 - 10.5 K/uL 6.1 - -  RBC 4.22 - 5.81 MIL/uL 4.59 - -  HGB 13.0 - 17.0 g/dL 13.8 11.4(L) 10.9(L)  HCT 39.0 - 52.0 % 41.6 34.3(L) 33.2(L)  PLT 150 - 400 K/uL PLATELETS APPEAR ADEQUATE - -  NEUTROABS 1.7 - 7.7 K/uL 3.2 - -  LYMPHSABS 0.7 - 4.0 K/uL 2.4 - -     There is no height or weight on file to calculate BMI.  Orders:  No orders of the defined types were placed in this encounter.  No orders of the defined types were placed in this encounter.    Procedures: No procedures performed  Clinical Data: No additional findings.  ROS:  All other systems negative, except as noted in the HPI. Review of Systems  Objective: Vital Signs: There were no vitals  taken for this visit.  Specialty Comments:  No specialty comments available.  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    Family History  Problem Relation Age of Onset   Diabetes Mother    Arthritis Mother    Cancer Mother    Hyperlipidemia Mother    Hypertension Mother    Arthritis Sister    Diabetes Sister    Arthritis Brother    Cancer Brother    Diabetes Maternal Grandmother    Diabetes Brother    Drug abuse Brother     Past Surgical History:  Procedure Laterality Date   ANKLE SURGERY  2005 & 2008   left ankle surgery--after fall of 40 feet   CYSTOSCOPY W/ URETERAL STENT PLACEMENT  02/02/2011   Procedure: CYSTOSCOPY WITH RETROGRADE PYELOGRAM/URETERAL STENT PLACEMENT;  Surgeon: Dutch Gray, MD;  Location: [facility];  Service: Urology;  Laterality: Right;  Cystoscopy Right Retrograde Right Ureteral Stent    (c-arm)    I & D EXTREMITY Right 11/23/2014   Procedure: VY FLAP TO RIGHT MIDDLE FINGER;  Surgeon: Charlotte Crumb, MD;  Location: [facility];  Service: Orthopedics;  Laterality: Right;   LEG AMPUTATION BELOW KNEE  2009   left below knee amputation -wears prosthesis   ROBOT ASSISTED PYELOPLASTY  02/02/2011   Procedure: ROBOTIC ASSISTED PYELOPLASTY WITH STENT PLACEMENT;  Surgeon: Dutch Gray, MD;  Location: [facility];  Service: Urology;  Laterality: Right;  Cystoscopy Right Retrograde Right Ureteral Stent    (c-arm)    tur bt  11/21/10   Social History    Occupational History   Not on file  Tobacco Use   Smoking status: Every Day    Packs/day: 0.50    Years: 20.00    Pack years: 10.00    Types: Cigarettes   Smokeless tobacco: Never  Vaping Use   Vaping Use: Not on file  Substance and Sexual Activity   Alcohol use: Yes   Drug use: No   Sexual activity: Not on file

## 2021-02-14 ENCOUNTER — Ambulatory Visit: Payer: Managed Care, Other (non HMO) | Admitting: Rehabilitative and Restorative Service Providers"

## 2021-04-04 DIAGNOSIS — Z8551 Personal history of malignant neoplasm of bladder: Secondary | ICD-10-CM | POA: Diagnosis not present

## 2021-04-18 NOTE — Progress Notes (Signed)
HPI: Mr. Michael Morrison is a 61 y.o.male here today for his routine physical examination.  Last CPE: unsure  Regular exercise:: 2 times/week treadmill or elliptical. Following a healthful diet: His wife cooks, started plant-based diet.  Chronic medical problems: Chronic back pain, Hx of bladder cancer,GERD, s/p left BKA amputation, wears a prosthetic leg.  Immunization History  Administered Date(s) Administered   Influenza,inj,Quad PF,6+ Mos 11/13/2018   Moderna Sars-Covid-2 Vaccination 05/15/2019, 06/17/2019   Pneumococcal Polysaccharide-23 04/19/2021   Tdap 11/23/2014    Health Maintenance  Topic Date Due   Hepatitis C Screening  Never done   COVID-19 Vaccine (3 - Booster for Moderna series) 05/05/2021 (Originally 08/12/2019)   INFLUENZA VACCINE  05/27/2021 (Originally 09/27/2020)   Zoster Vaccines- Shingrix (1 of 2) 03/03/2022 (Originally 09/03/2008)   HIV Screening  04/19/2022 (Originally 09[birth year]/1973)   TETANUS/TDAP  11/22/2024   COLONOSCOPY (Pts 45-60yrs Insurance coverage will need to be confirmed)  08/24/2025   HPV VACCINES  Aged Out  Last colonoscopy 3 years ago and 6 years f/u was recommended. Dr Benson Norway is his GI.  Last prostate ca screening:  Follows with urologist annually, bladder cancer treated in 2012. -Negative for high alcohol intake. + Smoker.  He does not have concerns today. 01/2011 glucose was 154, no hx of diabetes. Has not had lipid screening done.  Review of Systems  Constitutional:  Negative for activity change, appetite change, fatigue and fever.  HENT:  Negative for mouth sores, nosebleeds, sore throat, trouble swallowing and voice change.   Eyes:  Negative for redness and visual disturbance.  Respiratory:  Negative for apnea, cough, shortness of breath and wheezing.   Cardiovascular:  Negative for chest pain, palpitations and leg swelling.  Gastrointestinal:  Negative for abdominal pain, blood in stool, nausea and vomiting.  Endocrine: Negative for  cold intolerance, heat intolerance, polydipsia, polyphagia and polyuria.  Genitourinary:  Negative for decreased urine volume, dysuria, genital sores, hematuria and testicular pain.  Musculoskeletal:  Positive for back pain. Negative for myalgias and neck pain.  Skin:  Negative for color change and rash.  Allergic/Immunologic: Negative for environmental allergies.  Neurological:  Negative for syncope, weakness and headaches.  Hematological:  Negative for adenopathy. Does not bruise/bleed easily.  Psychiatric/Behavioral:  Negative for confusion. The patient is not nervous/anxious.   All other systems reviewed and are negative.  Current Outpatient Medications on File Prior to Visit  Medication Sig Dispense Refill   omeprazole (PRILOSEC) 40 MG capsule TAKE 1 CAPSULE(40 MG) BY MOUTH DAILY 90 capsule 0   No current facility-administered medications on file prior to visit.   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   Past Surgical History:  Procedure Laterality Date   ANKLE SURGERY  2005 & 2008   left ankle surgery--after fall of 40 feet   CYSTOSCOPY W/ URETERAL STENT PLACEMENT  02/02/2011   Procedure: CYSTOSCOPY WITH RETROGRADE PYELOGRAM/URETERAL STENT PLACEMENT;  Surgeon: Dutch Gray, MD;  Location: [facility];  Service: Urology;  Laterality: Right;  Cystoscopy Right Retrograde Right Ureteral Stent    (c-arm)    I & D EXTREMITY Right 11/23/2014   Procedure: VY FLAP TO RIGHT MIDDLE FINGER;  Surgeon: Charlotte Crumb, MD;  Location: [facility];  Service: Orthopedics;  Laterality: Right;  LEG AMPUTATION BELOW KNEE  2009   left below knee amputation -wears prosthesis   ROBOT ASSISTED PYELOPLASTY  02/02/2011   Procedure: ROBOTIC ASSISTED PYELOPLASTY WITH STENT  PLACEMENT;  Surgeon: Dutch Gray, MD;  Location: [facility];  Service: Urology;  Laterality: Right;  Cystoscopy Right Retrograde Right Ureteral Stent    (c-arm)    tur bt  11/21/10   Allergies  Allergen Reactions   Other    Family History  Problem Relation Age of Onset   Diabetes Mother    Arthritis Mother    Cancer Mother    Hyperlipidemia Mother    Hypertension Mother    Arthritis Sister    Diabetes Sister    Arthritis Brother    Cancer Brother    Diabetes Maternal Grandmother    Diabetes Brother    Drug abuse Brother     Social History   Socioeconomic History   Marital status: Divorced    Spouse name: Not on file   Number of children: Not on file   Years of education: Not on file   Highest education level: Not on file  Occupational History   Not on file  Tobacco Use   Smoking status: Every Day    Packs/day: 0.50    Years: 20.00    Pack years: 10.00    Types: Cigarettes    Start date: 09/03/1980   Smokeless tobacco: Never  Vaping Use   Vaping Use: Not on file  Substance and Sexual Activity   Alcohol use: Yes   Drug use: No   Sexual activity: Not on file  Other Topics Concern   Not on file  Social History Narrative   Not on file   Social Determinants of Health   Financial Resource Strain: Not on file  Food Insecurity: Not on file  Transportation Needs: Not on file  Physical Activity: Not on file  Stress: Not on file  Social Connections: Not on file   Vitals:   04/19/21 0923  BP: 130/80  Pulse: 62  Resp: 16  SpO2: 99%   Body mass index is 29.18 kg/m.  Wt Readings from Last 3 Encounters:  04/19/21 215 lb 2 oz (97.6 kg)  05/20/19 195 lb (88.5 kg)  04/22/19 195 lb (88.5 kg)   Physical Exam Vitals and nursing note reviewed.  Constitutional:      General: He is not in acute distress.    Appearance: He is well-developed.  HENT:     Head: Normocephalic and atraumatic.     Right Ear: Tympanic membrane, ear canal and external ear normal.     Left  Ear: Tympanic membrane, ear canal and external ear normal.  Eyes:     Conjunctiva/sclera: Conjunctivae normal.     Pupils: Pupils are equal, round, and reactive to light.  Neck:     Thyroid: Thyromegaly (Mild) present.     Trachea: No tracheal deviation.  Cardiovascular:     Rate and Rhythm: Normal rate and regular rhythm.     Pulses:          Dorsalis pedis pulses are 2+ on the right side. Left dorsalis pedis pulse not accessible.     Heart sounds: No murmur heard. Pulmonary:     Effort: Pulmonary effort is normal. No respiratory distress.     Breath sounds: Normal breath sounds.  Abdominal:     Palpations: Abdomen is soft. There is no hepatomegaly or mass.     Tenderness: There is no abdominal tenderness.  Genitourinary:  Comments: No concerns. Musculoskeletal:        General: No tenderness.     Cervical back: Normal range of motion.     Comments: No signs of synovitis.     Left Lower Extremity: Left leg is amputated below knee.  Lymphadenopathy:     Cervical: No cervical adenopathy.     Upper Body:     Right upper body: No supraclavicular adenopathy.     Left upper body: No supraclavicular adenopathy.  Skin:    General: Skin is warm.     Findings: No erythema.  Neurological:     Mental Status: He is alert and oriented to person, place, and time.     Cranial Nerves: No cranial nerve deficit.     Sensory: No sensory deficit.     Coordination: Coordination normal.     Gait: Gait normal.     Deep Tendon Reflexes:     Reflex Scores:      Bicep reflexes are 2+ on the right side and 2+ on the left side.      Patellar reflexes are 2+ on the right side and 2+ on the left side.  ASSESSMENT AND PLAN:  Mr.Son was seen today for annual exam.  Diagnoses and all orders for this visit: Orders Placed This Encounter  Procedures   Pneumococcal polysaccharide vaccine 23-valent greater than or equal to 2yo subcutaneous/IM   Lipid panel   Basic Metabolic Panel   Hep C Antibody    Hemoglobin A1c   TSH   Lab Results  Component Value Date   HGBA1C 5.8 04/19/2021   Lab Results  Component Value Date   CHOL 138 04/19/2021   HDL 36.90 (L) 04/19/2021   LDLCALC 89 04/19/2021   TRIG 61.0 04/19/2021   CHOLHDL 4 04/19/2021   Lab Results  Component Value Date   TSH 1.94 04/19/2021   Lab Results  Component Value Date   CREATININE 0.89 04/19/2021   BUN 13 04/19/2021   NA 137 04/19/2021   K 4.4 04/19/2021   CL 106 04/19/2021   CO2 25 04/19/2021   Routine general medical examination at a health care facility We discussed the importance of regular physical activity and healthy diet for prevention of chronic illness and/or complications. Preventive guidelines reviewed. Vaccination updated. Rx for shingrix given to get it at his pharmacy. Will try to obtain copy of colonoscopy. Next CPE in a year.  -     Zoster Vaccine Adjuvanted [facility]) injection; 0.5 ml in muscle and repeat in 8 weeks The 10-year ASCVD risk score (Arnett DK, et al., 2019) is: 15%   Values used to calculate the score:     Age: 60 years     Sex: Male     Is Non-Hispanic African American: Yes     Diabetic: No     Tobacco smoker: Yes     Systolic Blood Pressure: 616 mmHg     Is BP treated: No     HDL Cholesterol: 36.9 mg/dL     Total Cholesterol: 138 mg/dL  Screening for lipoid disorders -     Lipid panel  Hyperglycemia Encouraged a healthy life style for diabetes prevention. Further recommendations according to HgA1C result.  Encounter for hepatitis C screening test for low risk patient -     Hep C Antibody  Enlarged thyroid gland Mild. I do not think we need imaging at this time, will give further recommendations according to TSH result.  Need for Streptococcus pneumoniae vaccination -  Pneumococcal polysaccharide vaccine 23-valent greater than or equal to 2yo subcutaneous/IM  History of left below knee amputation ([place]) In 2009. Very active with no significant  functional limitations, he wears a prosthetic leg. Follows with Dr Sharol Given prn.  Tobacco use disorder We discusses adverse effects of tobacco use and benefits of smoking cessation. He is not interested in pharmacologic treatments. His wife is planning on smoking cessation, he feels like this would help him to quit.  Hx of bladder cancer Smoking cessation encouraged. Follows with urologist annually.  Return in 1 year (on 04/19/2022) for CPE.  Michael Haubner G. Martinique, MD  [facility]. [place] office.

## 2021-04-19 ENCOUNTER — Telehealth: Payer: Self-pay

## 2021-04-19 ENCOUNTER — Encounter: Payer: Self-pay | Admitting: Family Medicine

## 2021-04-19 ENCOUNTER — Ambulatory Visit (INDEPENDENT_AMBULATORY_CARE_PROVIDER_SITE_OTHER): Payer: Medicare Other | Admitting: Family Medicine

## 2021-04-19 VITALS — BP 130/80 | HR 62 | Resp 16 | Ht 72.0 in | Wt 215.1 lb

## 2021-04-19 DIAGNOSIS — F17200 Nicotine dependence, unspecified, uncomplicated: Secondary | ICD-10-CM | POA: Diagnosis not present

## 2021-04-19 DIAGNOSIS — Z1159 Encounter for screening for other viral diseases: Secondary | ICD-10-CM | POA: Diagnosis not present

## 2021-04-19 DIAGNOSIS — Z125 Encounter for screening for malignant neoplasm of prostate: Secondary | ICD-10-CM

## 2021-04-19 DIAGNOSIS — Z130 Encounter for screening for diseases of the blood and blood-forming organs and certain disorders involving the immune mechanism: Secondary | ICD-10-CM

## 2021-04-19 DIAGNOSIS — Z89512 Acquired absence of left leg below knee: Secondary | ICD-10-CM | POA: Diagnosis not present

## 2021-04-19 DIAGNOSIS — Z13220 Encounter for screening for lipoid disorders: Secondary | ICD-10-CM | POA: Diagnosis not present

## 2021-04-19 DIAGNOSIS — R739 Hyperglycemia, unspecified: Secondary | ICD-10-CM | POA: Diagnosis not present

## 2021-04-19 DIAGNOSIS — Z8551 Personal history of malignant neoplasm of bladder: Secondary | ICD-10-CM

## 2021-04-19 DIAGNOSIS — Z0000 Encounter for general adult medical examination without abnormal findings: Secondary | ICD-10-CM

## 2021-04-19 DIAGNOSIS — E049 Nontoxic goiter, unspecified: Secondary | ICD-10-CM | POA: Diagnosis not present

## 2021-04-19 DIAGNOSIS — Z23 Encounter for immunization: Secondary | ICD-10-CM | POA: Diagnosis not present

## 2021-04-19 LAB — LIPID PANEL
Cholesterol: 138 mg/dL (ref 0–200)
HDL: 36.9 mg/dL — ABNORMAL LOW (ref >39.00)
LDL Cholesterol: 89 mg/dL (ref 0–99)
NonHDL: 101.17
Total CHOL/HDL Ratio: 4
Triglycerides: 61 mg/dL (ref 0.0–149.0)
VLDL: 12.2 mg/dL (ref 0.0–40.0)

## 2021-04-19 LAB — BASIC METABOLIC PANEL
BUN: 13 mg/dL (ref 6–23)
CO2: 25 mEq/L (ref 19–32)
Calcium: 9.4 mg/dL (ref 8.4–10.5)
Chloride: 106 mEq/L (ref 96–112)
Creatinine, Ser: 0.89 mg/dL (ref 0.40–1.50)
GFR: 91.77 mL/min (ref >60.00)
Glucose, Bld: 104 mg/dL — ABNORMAL HIGH (ref 70–99)
Potassium: 4.4 mEq/L (ref 3.5–5.1)
Sodium: 137 mEq/L (ref 135–145)

## 2021-04-19 LAB — TSH: TSH: 1.94 u[IU]/mL (ref 0.35–5.50)

## 2021-04-19 LAB — HEMOGLOBIN A1C: Hgb A1c MFr Bld: 5.8 % (ref 4.6–6.5)

## 2021-04-19 MED ORDER — SHINGRIX 50 MCG/0.5ML IM SUSR: INTRAMUSCULAR | 1 refills | Status: DC

## 2021-04-19 NOTE — Assessment & Plan Note (Signed)
We discusses adverse effects of tobacco use and benefits of smoking cessation. He is not interested in pharmacologic treatments. His wife is planning on smoking cessation, he feels like this would help him to quit.

## 2021-04-19 NOTE — Patient Instructions (Addendum)
A few things to remember from today's visit:  Routine general medical examination at a health care facility  Screening for lipoid disorders - Plan: Lipid panel  Hyperglycemia - Plan: Basic Metabolic Panel, Hemoglobin A1c  Encounter for hepatitis C screening test for low risk patient - Plan: Hep C Antibody  Please sign a released form to obtain copy of your last colonoscopy.  Do not use My Chart to request refills or for acute issues that need immediate attention.   Please be sure medication list is accurate. If a new problem present, please set up appointment sooner than planned today.  Health Maintenance, Male Adopting a healthy lifestyle and getting preventive care are important in promoting health and wellness. Ask your health care provider about: The right schedule for you to have regular tests and exams. Things you can do on your own to prevent diseases and keep yourself healthy. What should I know about diet, weight, and exercise? Eat a healthy diet  Eat a diet that includes plenty of vegetables, fruits, low-fat dairy products, and lean protein. Do not eat a lot of foods that are high in solid fats, added sugars, or sodium. Maintain a healthy weight Body mass index (BMI) is a measurement that can be used to identify possible weight problems. It estimates body fat based on height and weight. Your health care provider can help determine your BMI and help you achieve or maintain a healthy weight. Get regular exercise Get regular exercise. This is one of the most important things you can do for your health. Most adults should: Exercise for at least 150 minutes each week. The exercise should increase your heart rate and make you sweat (moderate-intensity exercise). Do strengthening exercises at least twice a week. This is in addition to the moderate-intensity exercise. Spend less time sitting. Even light physical activity can be beneficial. Watch cholesterol and blood lipids Have  your blood tested for lipids and cholesterol at 63 years of age, then have this test every 5 years. You may need to have your cholesterol levels checked more often if: Your lipid or cholesterol levels are high. You are older than 63 years of age. You are at high risk for heart disease. What should I know about cancer screening? Many types of cancers can be detected early and may often be prevented. Depending on your health history and family history, you may need to have cancer screening at various ages. This may include screening for: Colorectal cancer. Prostate cancer. Skin cancer. Lung cancer. What should I know about heart disease, diabetes, and high blood pressure? Blood pressure and heart disease High blood pressure causes heart disease and increases the risk of stroke. This is more likely to develop in people who have high blood pressure readings or are overweight. Talk with your health care provider about your target blood pressure readings. Have your blood pressure checked: Every 3-5 years if you are 60-77 years of age. Every year if you are 57 years old or older. If you are between the ages of 45 and 3 and are a current or former smoker, ask your health care provider if you should have a one-time screening for abdominal aortic aneurysm (AAA). Diabetes Have regular diabetes screenings. This checks your fasting blood sugar level. Have the screening done: Once every three years after age 21 if you are at a normal weight and have a low risk for diabetes. More often and at a younger age if you are overweight or have a high risk  for diabetes. What should I know about preventing infection? Hepatitis B If you have a higher risk for hepatitis B, you should be screened for this virus. Talk with your health care provider to find out if you are at risk for hepatitis B infection. Hepatitis C Blood testing is recommended for: Everyone born from [birth year] through 1967. Anyone with known risk  factors for hepatitis C. Sexually transmitted infections (STIs) You should be screened each year for STIs, including gonorrhea and chlamydia, if: You are sexually active and are younger than 63 years of age. You are older than 63 years of age and your health care provider tells you that you are at risk for this type of infection. Your sexual activity has changed since you were last screened, and you are at increased risk for chlamydia or gonorrhea. Ask your health care provider if you are at risk. Ask your health care provider about whether you are at high risk for HIV. Your health care provider may recommend a prescription medicine to help prevent HIV infection. If you choose to take medicine to prevent HIV, you should first get tested for HIV. You should then be tested every 3 months for as long as you are taking the medicine. Follow these instructions at home: Alcohol use Do not drink alcohol if your health care provider tells you not to drink. If you drink alcohol: Limit how much you have to 0-2 drinks a day. Know how much alcohol is in your drink. In the [place] one drink equals one 12 oz bottle of beer (355 mL), one 5 oz glass of wine (148 mL), or one 1 oz glass of hard liquor (44 mL). Lifestyle Do not use any products that contain nicotine or tobacco. These products include cigarettes, chewing tobacco, and vaping devices, such as e-cigarettes. If you need help quitting, ask your health care provider. Do not use street drugs. Do not share needles. Ask your health care provider for help if you need support or information about quitting drugs. General instructions Schedule regular health, dental, and eye exams. Stay current with your vaccines. Tell your health care provider if: You often feel depressed. You have ever been abused or do not feel safe at home. Summary Adopting a healthy lifestyle and getting preventive care are important in promoting health and wellness. Follow your health  care provider's instructions about healthy diet, exercising, and getting tested or screened for diseases. Follow your health care provider's instructions on monitoring your cholesterol and blood pressure. This information is not intended to replace advice given to you by your health care provider. Make sure you discuss any questions you have with your health care provider. Document Revised: 07/05/2020 Document Reviewed: 07/05/2020 Elsevier Patient Education  [place].

## 2021-04-19 NOTE — Telephone Encounter (Signed)
Received note from [facility] that pt's wife was concerned because patient received flu shot at his physical this morning, but he had that in January.  I called and spoke with his wife. Advised her that he received the pneumonia vaccine this morning, not the flu vaccine.   Pt's wife verbalized understanding and thanked me for the call.

## 2021-04-19 NOTE — Assessment & Plan Note (Signed)
Smoking cessation encouraged. Follows with urologist annually.

## 2021-04-20 ENCOUNTER — Other Ambulatory Visit: Payer: Self-pay

## 2021-04-20 DIAGNOSIS — Z89512 Acquired absence of left leg below knee: Secondary | ICD-10-CM | POA: Insufficient documentation

## 2021-04-20 LAB — HEPATITIS C ANTIBODY
Hepatitis C Ab: NONREACTIVE
SIGNAL TO CUT-OFF: <0.02 (ref <1.00)

## 2021-04-20 MED ORDER — ROSUVASTATIN CALCIUM 10 MG PO TABS: 10.0000 mg | ORAL_TABLET | Freq: Every day | ORAL | 3 refills | Status: AC

## 2021-04-27 ENCOUNTER — Ambulatory Visit: Payer: Medicare Other | Admitting: Family

## 2021-05-26 ENCOUNTER — Ambulatory Visit: Payer: Medicare Other

## 2021-05-26 ENCOUNTER — Telehealth: Payer: Self-pay

## 2021-05-26 ENCOUNTER — Encounter: Payer: Self-pay | Admitting: Orthopedic Surgery

## 2021-05-26 ENCOUNTER — Ambulatory Visit: Payer: Self-pay

## 2021-05-26 ENCOUNTER — Ambulatory Visit: Payer: Medicare Other | Admitting: Orthopedic Surgery

## 2021-05-26 DIAGNOSIS — Z89512 Acquired absence of left leg below knee: Secondary | ICD-10-CM | POA: Diagnosis not present

## 2021-05-26 NOTE — Progress Notes (Signed)
? ?Office Visit Note ?  ?Patient: Michael Morrison           ?Date of Birth: 1961/01/02           ?MRN: [id number] ?Visit Date: 05/26/2021 ?             ?Requested by: Martinique, Betty G, MD ?[address] [place] ?[place],  [place] [address] ?PCP: Martinique, Betty G, MD ? ?Chief Complaint  ?Patient presents with  ? Left Leg - Pain  ?  Hx left BKA  ? ? ? ? ?HPI: ?Patient is a 61 year old gentleman who has been an active K3 ambulator with his left transtibial amputation.  Patient has had progressive decrease in volume in the residual limb and now has a prominent fibular head.  Patient has pain over the fibular head with activities of daily living.  Patient is currently in a test socket that Merry Proud is modifying. ? ?Assessment & Plan: ?Visit Diagnoses:  ?1. History of left below knee amputation ([place])   ? ? ?Plan: Patient will continue his socket modifications.  Discussed that there are no surgical interventions available to make the fibular head less prominent. ? ?Follow-Up Instructions: Return in about 4 weeks (around 06/23/2021).  ? ?Ortho Exam ? ?Patient is alert, oriented, no adenopathy, well-dressed, normal affect, normal respiratory effort. ?Examination patient has significant loss of volume in the residual limb on the left.  There is no redness no ulcerations no callus.  The fibular head is prominent.  Patient has an area of relaxed in his socket to accommodate the prominent fibular head. ? ?Patient is an existing left transtibial  amputee. ? ?Patient's current comorbidities are not expected to impact the ability to function with the prescribed prosthesis. ?Patient verbally communicates a strong desire to use a prosthesis. ?Patient currently requires mobility aids to ambulate without a prosthesis.  Expects not to use mobility aids with a new prosthesis. ? ?Patient is a K3 level ambulator that spends a lot of time walking around on uneven terrain over obstacles, up and down stairs, and ambulates with a variable  cadence. ? ? ? ? ?Imaging: ?No results found. ?No images are attached to the encounter. ? ?Labs: ?Lab Results  ?Component Value Date  ? HGBA1C 5.8 04/19/2021  ? ESRSEDRATE 87 (H) 05/22/2007  ? ESRSEDRATE 52 (H) 05/20/2007  ? LABURIC 4.1 05/20/2007  ? REPTSTATUS 04/17/2014 FINAL 04/14/2014  ? GRAMSTAIN  04/14/2014  ?  MODERATE WBC PRESENT,BOTH PMN AND MONONUCLEAR ?NO SQUAMOUS EPITHELIAL CELLS SEEN ?FEW GRAM POSITIVE RODS ?RARE GRAM POSITIVE COCCI ?IN PAIRS ?  ? CULT  04/14/2014  ?  MULTIPLE ORGANISMS PRESENT, NONE PREDOMINANT ?Note: NO STAPHYLOCOCCUS AUREUS ISOLATED NO GROUP A STREP (S.PYOGENES) ISOLATED ?Performed at [facility] ?  ? LABORGA METHICILLIN RESISTANT STAPHYLOCOCCUS AUREUS 05/24/2007  ? ? ? ?Lab Results  ?Component Value Date  ? ALBUMIN 4.0 11/23/2010  ? ALBUMIN 3.8 10/25/2010  ? ALBUMIN 2.8 (L) 05/23/2007  ? ? ?No results found for: MG ?No results found for: VD25OH ? ?No results found for: PREALBUMIN ? ?  Latest Ref Rng & Units 11/23/2014  ? 12:30 PM 02/03/2011  ? 10:47 AM 02/03/2011  ?  4:47 AM  ?CBC EXTENDED  ?WBC 4.0 - 10.5 K/uL 6.1      ?RBC 4.22 - 5.81 MIL/uL 4.59      ?Hemoglobin 13.0 - 17.0 g/dL 13.8   11.4   10.9    ?HCT 39.0 - 52.0 % 41.6   34.3   33.2    ?  Platelets 150 - 400 K/uL PLATELETS APPEAR ADEQUATE      ?NEUT# 1.7 - 7.7 K/uL 3.2      ?Lymph# 0.7 - 4.0 K/uL 2.4      ? ? ? ?There is no height or weight on file to calculate BMI. ? ?Orders:  ?No orders of the defined types were placed in this encounter. ? ?No orders of the defined types were placed in this encounter. ? ? ? Procedures: ?No procedures performed ? ?Clinical Data: ?No additional findings. ? ?ROS: ? ?All other systems negative, except as noted in the HPI. ?Review of Systems ? ?Objective: ?Vital Signs: There were no vitals taken for this visit. ? ?Specialty Comments:  ?No specialty comments available. ? ?PMFS History: ?Patient Active Problem List  ? Diagnosis Date Noted  ? History of left below knee amputation ([place])  04/20/2021  ? Hx of bladder cancer 04/19/2021  ? Tobacco use disorder 11/13/2018  ? Gastroesophageal reflux disease 11/13/2018  ? METHICILLIN RESISTANT STAPHYLOCOCCUS AUREUS INFECTION 07/03/2007  ? BKA, LEFT LEG 07/03/2007  ? ?Past Medical History:  ?Diagnosis Date  ? Amputation of leg below knee, left, traumatic, complicated t  ? HX OF MRSA INFECTION LEFT ANKLE AFTER INJURY AND MULTIPLE SURGERIES  ? GERD (gastroesophageal reflux disease)   ? Ureteral obstruction   ? right ureteropelvic junction obstruction  ?  ?Family History  ?Problem Relation Age of Onset  ? Diabetes Mother   ? Arthritis Mother   ? Cancer Mother   ? Hyperlipidemia Mother   ? Hypertension Mother   ? Arthritis Sister   ? Diabetes Sister   ? Arthritis Brother   ? Cancer Brother   ? Diabetes Maternal Grandmother   ? Diabetes Brother   ? Drug abuse Brother   ?  ?Past Surgical History:  ?Procedure Laterality Date  ? ANKLE SURGERY  2005 & 2008  ? left ankle surgery--after fall of 40 feet  ? CYSTOSCOPY W/ URETERAL STENT PLACEMENT  02/02/2011  ? Procedure: CYSTOSCOPY WITH RETROGRADE PYELOGRAM/URETERAL STENT PLACEMENT;  Surgeon: Dutch Gray, MD;  Location: [facility];  Service: Urology;  Laterality: Right;  Cystoscopy Right Retrograde Right Ureteral Stent    (c-arm)   ? I & D EXTREMITY Right 11/23/2014  ? Procedure: VY FLAP TO RIGHT MIDDLE FINGER;  Surgeon: Charlotte Crumb, MD;  Location: [facility];  Service: Orthopedics;  Laterality: Right;  ? LEG AMPUTATION BELOW KNEE  2009  ? left below knee amputation -wears prosthesis  ? ROBOT ASSISTED PYELOPLASTY  02/02/2011  ? Procedure: ROBOTIC ASSISTED PYELOPLASTY WITH STENT PLACEMENT;  Surgeon: Dutch Gray, MD;  Location: [facility];  Service: Urology;  Laterality: Right;  Cystoscopy Right Retrograde Right Ureteral Stent    (c-arm)   ? tur bt  11/21/10  ? ?Social History  ? ?Occupational History  ? Not on file  ?Tobacco Use  ? Smoking status: Every Day  ?  Packs/day: 0.50  ?  Years: 20.00  ?  Pack years: 10.00  ?  Types:  Cigarettes  ?  Start date: 09/03/1980  ? Smokeless tobacco: Never  ?Vaping Use  ? Vaping Use: Not on file  ?Substance and Sexual Activity  ? Alcohol use: Yes  ? Drug use: No  ? Sexual activity: Not on file  ? ? ? ? ? ?

## 2021-05-26 NOTE — Telephone Encounter (Signed)
Contacted patient on preferred number listed in notes. Patient stated unable to completed scheduled AWV.  today will call back to reschedule. ?

## 2021-05-31 ENCOUNTER — Telehealth: Payer: Self-pay

## 2021-05-31 ENCOUNTER — Ambulatory Visit: Payer: Medicare Other

## 2021-05-31 NOTE — Telephone Encounter (Signed)
Contacted patient on preferred number listed in notes for scheduled AWV. Patient stated currently at a emergency appointment with another provider will call back to reschedule. ?

## 2021-06-08 DIAGNOSIS — H524 Presbyopia: Secondary | ICD-10-CM | POA: Diagnosis not present

## 2021-06-13 ENCOUNTER — Ambulatory Visit: Payer: Medicare Other

## 2021-06-13 ENCOUNTER — Telehealth: Payer: Self-pay

## 2021-06-13 NOTE — Telephone Encounter (Signed)
This nurse called patient for scheduled telephonic AWV. Patient states that he is currently at another appointment. He said that he is going to be away next week, but he will call to reschedule or we can call him to reschedule. ?

## 2021-06-14 DIAGNOSIS — Z89512 Acquired absence of left leg below knee: Secondary | ICD-10-CM | POA: Diagnosis not present

## 2021-06-27 ENCOUNTER — Telehealth: Payer: Self-pay | Admitting: Family Medicine

## 2021-06-27 NOTE — Telephone Encounter (Signed)
Tried calling patient to schedule Medicare Annual Wellness Visit (AWV) either virtually or in office. L ? ? ?No answer ? ?  awvi 02/27/09 per [facility]  ? please schedule at anytime with [facility] Nurse Health Advisor 1 or 2 ? ? ?This should be a 45 minute visit.  ?

## 2021-07-12 ENCOUNTER — Ambulatory Visit (INDEPENDENT_AMBULATORY_CARE_PROVIDER_SITE_OTHER): Payer: Medicare Other

## 2021-07-12 VITALS — Ht 72.0 in | Wt 215.0 lb

## 2021-07-12 DIAGNOSIS — Z0000 Encounter for general adult medical examination without abnormal findings: Secondary | ICD-10-CM | POA: Diagnosis not present

## 2021-07-12 NOTE — Patient Instructions (Addendum)
?Mr. Barren , ?Thank you for taking time to come for your Medicare Wellness Visit. I appreciate your ongoing commitment to your health goals. Please review the following plan we discussed and let me know if I can assist you in the future.  ? ?These are the goals we discussed: ? Goals   ? ?   More vacations (pt-stated)   ?   Take more vacations. ?  ? ?  ?  ?This is a list of the screening recommended for you and due dates:  ?Health Maintenance  ?Topic Date Due  ? Zoster (Shingles) Vaccine (1 of 2) 03/03/2022*  ? HIV Screening  04/19/2022*  ? Flu Shot  09/27/2021  ? Tetanus Vaccine  11/22/2024  ? Colon Cancer Screening  08/24/2025  ? Hepatitis C Screening: USPSTF Recommendation to screen - Ages 37-79 yo.  Completed  ? HPV Vaccine  Aged Out  ? COVID-19 Vaccine  Discontinued  ?*Topic was postponed. The date shown is not the original due date.  ? ?Advanced directives: Yes Patient will submit copy ? ?Conditions/risks identified: None ? ?Next appointment: Follow up in one year for your annual wellness visit  ? ? ?Preventive Care 40-64 Years, Male ?Preventive care refers to lifestyle choices and visits with your health care provider that can promote health and wellness. ?What does preventive care include? ?A yearly physical exam. This is also called an annual well check. ?Dental exams once or twice a year. ?Routine eye exams. Ask your health care provider how often you should have your eyes checked. ?Personal lifestyle choices, including: ?Daily care of your teeth and gums. ?Regular physical activity. ?Eating a healthy diet. ?Avoiding tobacco and drug use. ?Limiting alcohol use. ?Practicing safe sex. ?Taking low-dose aspirin every day starting at age 42. ?What happens during an annual well check? ?The services and screenings done by your health care provider during your annual well check will depend on your age, overall health, lifestyle risk factors, and family history of disease. ?Counseling  ?Your health care provider  may ask you questions about your: ?Alcohol use. ?Tobacco use. ?Drug use. ?Emotional well-being. ?Home and relationship well-being. ?Sexual activity. ?Eating habits. ?Work and work Statistician. ?Screening  ?You may have the following tests or measurements: ?Height, weight, and BMI. ?Blood pressure. ?Lipid and cholesterol levels. These may be checked every 5 years, or more frequently if you are over 66 years old. ?Skin check. ?Lung cancer screening. You may have this screening every year starting at age 61 if you have a 30-pack-year history of smoking and currently smoke or have quit within the past 15 years. ?Fecal occult blood test (FOBT) of the stool. You may have this test every year starting at age 11. ?Flexible sigmoidoscopy or colonoscopy. You may have a sigmoidoscopy every 5 years or a colonoscopy every 10 years starting at age 76. ?Prostate cancer screening. Recommendations will vary depending on your family history and other risks. ?Hepatitis C blood test. ?Hepatitis B blood test. ?Sexually transmitted disease (STD) testing. ?Diabetes screening. This is done by checking your blood sugar (glucose) after you have not eaten for a while (fasting). You may have this done every 1-3 years. ?Discuss your test results, treatment options, and if necessary, the need for more tests with your health care provider. ?Vaccines  ?Your health care provider may recommend certain vaccines, such as: ?Influenza vaccine. This is recommended every year. ?Tetanus, diphtheria, and acellular pertussis (Tdap, Td) vaccine. You may need a Td booster every 10 years. ?Zoster vaccine. You may  need this after age 76. ?Pneumococcal 13-valent conjugate (PCV13) vaccine. You may need this if you have certain conditions and have not been vaccinated. ?Pneumococcal polysaccharide (PPSV23) vaccine. You may need one or two doses if you smoke cigarettes or if you have certain conditions. ?Talk to your health care provider about which screenings and  vaccines you need and how often you need them. ?This information is not intended to replace advice given to you by your health care provider. Make sure you discuss any questions you have with your health care provider. ?Document Released: 03/12/2015 Document Revised: 11/03/2015 Document Reviewed: 12/15/2014 ?Elsevier Interactive Patient Education ? 2017 [employer]. ? ?Fall Prevention in the Home ?Falls can cause injuries. They can happen to people of all ages. There are many things you can do to make your home safe and to help prevent falls. ?What can I do on the outside of my home? ?Regularly fix the edges of walkways and driveways and fix any cracks. ?Remove anything that might make you trip as you walk through a door, such as a raised step or threshold. ?Trim any bushes or trees on the path to your home. ?Use bright outdoor lighting. ?Clear any walking paths of anything that might make someone trip, such as rocks or tools. ?Regularly check to see if handrails are loose or broken. Make sure that both sides of any steps have handrails. ?Any raised decks and porches should have guardrails on the edges. ?Have any leaves, snow, or ice cleared regularly. ?Use sand or salt on walking paths during winter. ?Clean up any spills in your garage right away. This includes oil or grease spills. ?What can I do in the bathroom? ?Use night lights. ?Install grab bars by the toilet and in the tub and shower. Do not use towel bars as grab bars. ?Use non-skid mats or decals in the tub or shower. ?If you need to sit down in the shower, use a plastic, non-slip stool. ?Keep the floor dry. Clean up any water that spills on the floor as soon as it happens. ?Remove soap buildup in the tub or shower regularly. ?Attach bath mats securely with double-sided non-slip rug tape. ?Do not have throw rugs and other things on the floor that can make you trip. ?What can I do in the bedroom? ?Use night lights. ?Make sure that you have a light by your  bed that is easy to reach. ?Do not use any sheets or blankets that are too big for your bed. They should not hang down onto the floor. ?Have a firm chair that has side arms. You can use this for support while you get dressed. ?Do not have throw rugs and other things on the floor that can make you trip. ?What can I do in the kitchen? ?Clean up any spills right away. ?Avoid walking on wet floors. ?Keep items that you use a lot in easy-to-reach places. ?If you need to reach something above you, use a strong step stool that has a grab bar. ?Keep electrical cords out of the way. ?Do not use floor polish or wax that makes floors slippery. If you must use wax, use non-skid floor wax. ?Do not have throw rugs and other things on the floor that can make you trip. ?What can I do with my stairs? ?Do not leave any items on the stairs. ?Make sure that there are handrails on both sides of the stairs and use them. Fix handrails that are broken or loose. Make sure that handrails  are as long as the stairways. ?Check any carpeting to make sure that it is firmly attached to the stairs. Fix any carpet that is loose or worn. ?Avoid having throw rugs at the top or bottom of the stairs. If you do have throw rugs, attach them to the floor with carpet tape. ?Make sure that you have a light switch at the top of the stairs and the bottom of the stairs. If you do not have them, ask someone to add them for you. ?What else can I do to help prevent falls? ?Wear shoes that: ?Do not have high heels. ?Have rubber bottoms. ?Are comfortable and fit you well. ?Are closed at the toe. Do not wear sandals. ?If you use a stepladder: ?Make sure that it is fully opened. Do not climb a closed stepladder. ?Make sure that both sides of the stepladder are locked into place. ?Ask someone to hold it for you, if possible. ?Clearly mark and make sure that you can see: ?Any grab bars or handrails. ?First and last steps. ?Where the edge of each step is. ?Use tools that  help you move around (mobility aids) if they are needed. These include: ?Canes. ?Walkers. ?Scooters. ?Crutches. ?Turn on the lights when you go into a dark area. Replace any light bulbs as soon as they

## 2021-07-12 NOTE — Progress Notes (Signed)
Subjective:   Michael Morrison is a 61 y.o. male who presents for Medicare Annual/Subsequent preventive examination.  Review of Systems    Virtual Visit via Telephone Note  I connected with  Charlaine Dalton on 07/12/21 at  9:45 AM EDT by telephone and verified that I am speaking with the correct person using two identifiers.  Location: Patient: Home Provider: Office Persons participating in the virtual visit: patient/Nurse Health Advisor   I discussed the limitations, risks, security and privacy concerns of performing an evaluation and management service by telephone and the availability of in person appointments. The patient expressed understanding and agreed to proceed.  Interactive audio and video telecommunications were attempted between this nurse and patient, however failed, due to patient having technical difficulties OR patient did not have access to video capability.  We continued and completed visit with audio only.  Some vital signs may be absent or patient reported.   Criselda Peaches, LPN  Cardiac Risk Factors include: advanced age (>61mn, >>85women);male gender;smoking/ tobacco exposure     Objective:    Today's Vitals   07/12/21 0950  Weight: 215 lb (97.5 kg)  Height: 6' (1.829 m)   Body mass index is 29.16 kg/m.     07/12/2021    9:59 AM 11/23/2014   11:28 AM 02/02/2011    3:35 PM 01/30/2011    9:53 AM  Advanced Directives  Does Patient Have a Medical Advance Directive? Yes No Patient has advance directive, copy not in chart Patient has advance directive, copy not in chart  Type of Advance Directive [place] will  [place] will [place] will  Does patient want to make changes to medical advance directive? No - Patient declined     Copy of [place] Chart? No - copy requested     Would patient like information on creating a medical advance directive?  No -  patient declined information    Pre-existing out of facility DNR order (yellow form or pink MOST form)   No     Current Medications (verified) Outpatient Encounter Medications as of 07/12/2021  Medication Sig   omeprazole (PRILOSEC) 40 MG capsule TAKE 1 CAPSULE(40 MG) BY MOUTH DAILY   rosuvastatin (CRESTOR) 10 MG tablet Take 1 tablet (10 mg total) by mouth daily.   Zoster Vaccine Adjuvanted ([facility] injection 0.5 ml in muscle and repeat in 8 weeks   No facility-administered encounter medications on file as of 07/12/2021.    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   Past Surgical History:  Procedure Laterality Date   ANKLE SURGERY  2005 & 2008   left ankle surgery--after fall of 40 feet   CYSTOSCOPY W/ URETERAL STENT PLACEMENT  02/02/2011   Procedure: CYSTOSCOPY WITH RETROGRADE PYELOGRAM/URETERAL STENT PLACEMENT;  Surgeon: LDutch Gray MD;  Location: [facility];  Service: Urology;  Laterality: Right;  Cystoscopy Right Retrograde Right Ureteral Stent    (c-arm)    I & D EXTREMITY Right 11/23/2014   Procedure: VY FLAP TO RIGHT MIDDLE FINGER;  Surgeon: MCharlotte Crumb MD;  [facility]  Service: Orthopedics;  Laterality: Right;   LEG AMPUTATION BELOW KNEE  2009  left below knee amputation -wears prosthesis   ROBOT ASSISTED PYELOPLASTY  02/02/2011   Procedure: ROBOTIC ASSISTED PYELOPLASTY WITH STENT PLACEMENT;  Surgeon: Dutch Gray, MD;  Location: [facility];  Service: Urology;  Laterality: Right;  Cystoscopy Right Retrograde Right Ureteral Stent    (c-arm)    tur bt  11/21/10   Family History  Problem Relation Age of Onset   Diabetes Mother    Arthritis Mother    Cancer Mother    Hyperlipidemia Mother     Hypertension Mother    Arthritis Sister    Diabetes Sister    Arthritis Brother    Cancer Brother    Diabetes Maternal Grandmother    Diabetes Brother    Drug abuse Brother    Social History   Socioeconomic History   Marital status: Divorced    Spouse name: Not on file   Number of children: Not on file   Years of education: Not on file   Highest education level: Not on file  Occupational History   Not on file  Tobacco Use   Smoking status: Every Day    Packs/day: 0.50    Years: 20.00    Pack years: 10.00    Types: Cigarettes    Start date: 09/03/1980   Smokeless tobacco: Never  Vaping Use   Vaping Use: Not on file  Substance and Sexual Activity   Alcohol use: Yes   Drug use: No   Sexual activity: Not on file  Other Topics Concern   Not on file  Social History Narrative   Not on file   Social Determinants of Health   Financial Resource Strain: Low Risk    Difficulty of Paying Living Expenses: Not hard at all  Food Insecurity: No Food Insecurity   Worried About Charity fundraiser in the Last Year: Never true   [place] in the Last Year: Never true  Transportation Needs: No Transportation Needs   Lack of Transportation (Medical): No   Lack of Transportation (Non-Medical): No  Physical Activity: Insufficiently Active   Days of Exercise per Week: 3 days   Minutes of Exercise per Session: 30 min  Stress: No Stress Concern Present   Feeling of Stress : Not at all  Social Connections: Moderately Isolated   Frequency of Communication with Friends and Family: More than three times a week   Frequency of Social Gatherings with Friends and Family: More than three times a week   Attends Religious Services: Never   Marine scientist or Organizations: No   Attends Archivist Meetings: Never   Marital Status: Married    Clinical Intake: How often do you need to have someone help you when you read instructions, pamphlets, or other written materials  from your doctor or pharmacy?: 1 - Never  Diabetic?  No  Activities of Daily Living    07/12/2021    9:57 AM  In your present state of health, do you have any difficulty performing the following activities:  Hearing? 0  Vision? 0  Difficulty concentrating or making decisions? 0  Walking or climbing stairs? 0  Dressing or bathing? 0  Doing errands, shopping? 0  Preparing Food and eating ? N  Using the Toilet? N  In the past six months, have you accidently leaked urine? N  Do you have problems with loss of bowel control? N  Managing your Medications? N  Managing your Finances? N  Housekeeping or managing your Housekeeping? N  Patient Care Team: [place], Betty G, MD as PCP - General (Family Medicine)  Indicate any recent Medical Services you may have received from other than [facility] providers in the past year (date may be approximate).     Assessment:   This is a routine wellness examination for Natthew.  Hearing/Vision screen Hearing Screening - Comments:: No hearing difficulty Vision Screening - Comments:: Wears glasses. Followed by [facility]  Dietary issues and exercise activities discussed: Exercise limited by: None identified   Goals Addressed               This Visit's Progress     More vacations (pt-stated)        Take more vacations.       Depression Screen    07/12/2021    9:56 AM 04/19/2021    9:33 AM 11/13/2018    6:32 PM  PHQ 2/9 Scores  PHQ - 2 Score 0 0 0    Fall Risk    07/12/2021    9:58 AM 11/13/2018    6:32 PM  [place] in the past year? 0 0  Number falls in past yr: 0 0  Injury with Fall? 0 0  Risk for fall due to : No Fall Risks     FALL RISK PREVENTION PERTAINING TO THE HOME:  Any stairs in or around the home? Yes If so, are there any without handrails? No  Home free of loose throw rugs in walkways, pet beds, electrical cords, etc? Yes  Adequate lighting in your home to reduce risk of falls? Yes   ASSISTIVE DEVICES  UTILIZED TO PREVENT FALLS:  Life alert? No  Use of a cane, walker or w/c? No  Grab bars in the bathroom? No  Shower chair or bench in shower? No  Elevated toilet seat or a handicapped toilet? No   TIMED UP AND GO:  Was the test performed? No . Audio Visit  Cognitive Function:    Immunizations Immunization History  Administered Date(s) Administered   Influenza,inj,Quad PF,6+ Mos 11/13/2018   Moderna Sars-Covid-2 Vaccination 05/15/2019, 06/17/2019   Pneumococcal Polysaccharide-23 04/19/2021   Tdap 11/23/2014    TDAP status: Up to date  Flu Vaccine status: Declined, Education has been provided regarding the importance of this vaccine but patient still declined. Advised may receive this vaccine at local pharmacy or Health Dept. Aware to provide a copy of the vaccination record if obtained from local pharmacy or Health Dept. Verbalized acceptance and understanding.  Pneumococcal vaccine status: Up to date  Covid-19 vaccine status: Completed vaccines  Qualifies for Shingles Vaccine? Yes   Zostavax completed No   Shingrix Completed?: No.    Education has been provided regarding the importance of this vaccine. Patient has been advised to call insurance company to determine out of pocket expense if they have not yet received this vaccine. Advised may also receive vaccine at local pharmacy or Health Dept. Verbalized acceptance and understanding.  Screening Tests Health Maintenance  Topic Date Due   Zoster Vaccines- Shingrix (1 of 2) 03/03/2022 (Originally 09/03/2008)   HIV Screening  04/19/2022 (Originally 09/03/1975)   INFLUENZA VACCINE  09/27/2021   TETANUS/TDAP  11/22/2024   COLONOSCOPY (Pts 45-17yr Insurance coverage will need to be confirmed)  08/24/2025   Hepatitis C Screening  Completed   HPV VACCINES  Aged Out   COVID-19 Vaccine  Discontinued    Health Maintenance  There are no preventive care reminders to display for this patient.   Colorectal  cancer screening: Type  of screening: Colonoscopy. Completed 08/25/15. Repeat every 10 years  Lung Cancer Screening: (Low Dose CT Chest recommended if Age 57-80 years, 30 pack-year currently smoking OR have quit w/in 15years.) does qualify.   Lung Cancer Screening Referral: Patient deferred  Additional Screening:  Hepatitis C Screening: does qualify; Completed 04/19/21  Vision Screening: Recommended annual ophthalmology exams for early detection of glaucoma and other disorders of the eye. Is the patient up to date with their annual eye exam?  Yes  Who is the provider or what is the name of the office in which the patient attends annual eye exams? [facility] If pt is not established with a provider, would they like to be referred to a provider to establish care? No .   Dental Screening: Recommended annual dental exams for proper oral hygiene  Community Resource Referral / Chronic Care Management:  CRR required this visit?  No   CCM required this visit?  No      Plan:     I have personally reviewed and noted the following in the patient's chart:   Medical and social history Use of alcohol, tobacco or illicit drugs  Current medications and supplements including opioid prescriptions. Patient is not currently taking opioid prescriptions. Functional ability and status Nutritional status Physical activity Advanced directives List of other physicians Hospitalizations, surgeries, and ER visits in previous 12 months Vitals Screenings to include cognitive, depression, and falls Referrals and appointments  In addition, I have reviewed and discussed with patient certain preventive protocols, quality metrics, and best practice recommendations. A written personalized care plan for preventive services as well as general preventive health recommendations were provided to patient.     Criselda Peaches, LPN   [id number]/50/[id number]   Nurse Notes: None

## 2022-05-09 ENCOUNTER — Ambulatory Visit: Payer: Self-pay

## 2022-05-09 ENCOUNTER — Ambulatory Visit: Payer: No Typology Code available for payment source | Admitting: Orthopedic Surgery

## 2022-05-09 ENCOUNTER — Encounter: Payer: Self-pay | Admitting: Orthopedic Surgery

## 2022-05-09 DIAGNOSIS — M25561 Pain in right knee: Secondary | ICD-10-CM | POA: Diagnosis not present

## 2022-05-09 DIAGNOSIS — Z89512 Acquired absence of left leg below knee: Secondary | ICD-10-CM

## 2022-05-09 DIAGNOSIS — G8929 Other chronic pain: Secondary | ICD-10-CM | POA: Diagnosis not present

## 2022-05-09 MED ORDER — HYDROCODONE-ACETAMINOPHEN 5-325 MG PO TABS: 1.0000 | ORAL_TABLET | Freq: Four times a day (QID) | ORAL | 0 refills | Status: DC | PRN

## 2022-05-09 NOTE — Progress Notes (Signed)
Office Visit Note   Patient: Michael Morrison           Date of Birth: 1960-03-20           MRN: [id number] Visit Date: 05/09/2022              Requested by: Martinique, Betty G, MD [address] [place],  [place] [address] PCP: Martinique, Betty G, MD  Chief Complaint  Patient presents with   Left Leg - Pain    Hx BKA   Right Knee - Pain      HPI: Patient is a 62 year old gentleman who presents with chronic right knee pain as well as pain over the fibular head left transtibial amputation.  Patient currently has a new socket from [place] but is having impingement pain over the fibular head and patient states that he is scheduled for a new socket fitting.  Assessment & Plan: Visit Diagnoses:  1. History of left below knee amputation ([name])   2. Chronic pain of right knee     Plan: Patient was provided a prescription for Vicodin for pain.  Follow-Up Instructions: Return if symptoms worsen or fail to improve.   Ortho Exam  Patient is alert, oriented, no adenopathy, well-dressed, normal affect, normal respiratory effort. Examination patient has a prominent fibular head.  There is no redness no cellulitis no signs of infection there is no skin breakdown patient has pain and burning numbness from impingement on the peroneal nerve with weightbearing.  Examination the right knee there is no effusion there is no tenderness to palpation over the medial lateral joint line.  Imaging: No results found. No images are attached to the encounter.  Labs: Lab Results  Component Value Date   HGBA1C 5.8 04/19/2021   ESRSEDRATE 87 (H) 05/22/2007   ESRSEDRATE 52 (H) 05/20/2007   LABURIC 4.1 05/20/2007   REPTSTATUS 04/17/2014 FINAL 04/14/2014   GRAMSTAIN  04/14/2014    MODERATE WBC PRESENT,BOTH PMN AND MONONUCLEAR NO SQUAMOUS EPITHELIAL CELLS SEEN FEW GRAM POSITIVE RODS RARE GRAM POSITIVE COCCI IN PAIRS    CULT  04/14/2014    MULTIPLE ORGANISMS PRESENT, NONE PREDOMINANT Note: NO  STAPHYLOCOCCUS AUREUS ISOLATED NO GROUP A STREP (S.PYOGENES) ISOLATED Performed at [facility] 05/24/2007     Lab Results  Component Value Date   ALBUMIN 4.0 11/23/2010   ALBUMIN 3.8 10/25/2010   ALBUMIN 2.8 (L) 05/23/2007    No results found for: "MG" No results found for: "VD25OH"  No results found for: "PREALBUMIN"    Latest Ref Rng & Units 11/23/2014   12:30 PM 02/03/2011   10:47 AM 02/03/2011    4:47 AM  CBC EXTENDED  WBC 4.0 - 10.5 K/uL 6.1     RBC 4.22 - 5.81 MIL/uL 4.59     Hemoglobin 13.0 - 17.0 g/dL 13.8  11.4  10.9   HCT 39.0 - 52.0 % 41.6  34.3  33.2   Platelets 150 - 400 K/uL PLATELETS APPEAR ADEQUATE     NEUT# 1.7 - 7.7 K/uL 3.2     Lymph# 0.7 - 4.0 K/uL 2.4        There is no height or weight on file to calculate BMI.  Orders:  No orders of the defined types were placed in this encounter.  Meds ordered this encounter  Medications   HYDROcodone-acetaminophen (NORCO/VICODIN) 5-325 MG tablet    Sig: Take 1 tablet by mouth every 6 (six) hours as needed  for moderate pain.    Dispense:  20 tablet    Refill:  0     Procedures: No procedures performed  Clinical Data: No additional findings.  ROS:  All other systems negative, except as noted in the HPI. Review of Systems  Objective: Vital Signs: There were no vitals taken for this visit.  Specialty Comments:  No specialty comments available.  PMFS History: Patient Active Problem List   Diagnosis Date Noted   History of left below knee amputation (Michael Morrison) 04/20/2021   Hx of bladder cancer 04/19/2021   Tobacco use disorder 11/13/2018   Gastroesophageal reflux disease 11/13/2018   METHICILLIN RESISTANT STAPHYLOCOCCUS AUREUS INFECTION 07/03/2007   BKA, LEFT LEG 07/03/2007   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   HX OF MRSA INFECTION LEFT ANKLE AFTER INJURY AND MULTIPLE  SURGERIES   GERD (gastroesophageal reflux disease)    Ureteral obstruction    right ureteropelvic junction obstruction    Family History  Problem Relation Age of Onset   Diabetes Mother    Arthritis Mother    Cancer Mother    Hyperlipidemia Mother    Hypertension Mother    Arthritis Sister    Diabetes Sister    Arthritis Brother    Cancer Brother    Diabetes Maternal Grandmother    Diabetes Brother    Drug abuse Brother     Past Surgical History:  Procedure Laterality Date   ANKLE SURGERY  2005 & 2008   left ankle surgery--after fall of 40 feet   CYSTOSCOPY W/ URETERAL STENT PLACEMENT  02/02/2011   Procedure: CYSTOSCOPY WITH RETROGRADE PYELOGRAM/URETERAL STENT PLACEMENT;  Surgeon: Michael Gray, MD;  Location: [facility];  Service: Urology;  Laterality: Right;  Cystoscopy Right Retrograde Right Ureteral Stent    (c-arm)    I & D EXTREMITY Right 11/23/2014   Procedure: VY FLAP TO RIGHT MIDDLE FINGER;  Surgeon: Charlotte Crumb, MD;  Location: [facility];  Service: Orthopedics;  Laterality: Right;   LEG AMPUTATION BELOW KNEE  2009   left below knee amputation -wears prosthesis   ROBOT ASSISTED PYELOPLASTY  02/02/2011   Procedure: ROBOTIC ASSISTED PYELOPLASTY WITH STENT PLACEMENT;  Surgeon: Michael Gray, MD;  Location: [facility];  Service: Urology;  Laterality: Right;  Cystoscopy Right Retrograde Right Ureteral Stent    (c-arm)    tur bt  11/21/10   Social History   Occupational History   Not on file  Tobacco Use   Smoking status: Every Day    Packs/day: 0.50    Years: 20.00    Total pack years: 10.00    Types: Cigarettes    Start date: 09/03/1980   Smokeless tobacco: Never  Vaping Use   Vaping Use: Not on file  Substance and Sexual Activity   Alcohol use: Yes   Drug use: No   Sexual activity: Not on file

## 2022-06-14 DIAGNOSIS — Z8551 Personal history of malignant neoplasm of bladder: Secondary | ICD-10-CM | POA: Diagnosis not present

## 2022-06-15 ENCOUNTER — Encounter: Payer: Self-pay | Admitting: Orthopedic Surgery

## 2022-06-15 ENCOUNTER — Ambulatory Visit: Payer: No Typology Code available for payment source | Admitting: Orthopedic Surgery

## 2022-06-15 DIAGNOSIS — Z89512 Acquired absence of left leg below knee: Secondary | ICD-10-CM | POA: Diagnosis not present

## 2022-06-15 DIAGNOSIS — G8929 Other chronic pain: Secondary | ICD-10-CM | POA: Diagnosis not present

## 2022-06-15 DIAGNOSIS — M25561 Pain in right knee: Secondary | ICD-10-CM

## 2022-06-15 MED ORDER — HYDROCODONE-ACETAMINOPHEN 10-325 MG PO TABS: 1.0000 | ORAL_TABLET | Freq: Four times a day (QID) | ORAL | 0 refills | Status: DC | PRN

## 2022-06-15 MED ORDER — METHOCARBAMOL 500 MG PO TABS: 500.0000 mg | ORAL_TABLET | Freq: Three times a day (TID) | ORAL | 0 refills | Status: DC | PRN

## 2022-06-15 NOTE — Progress Notes (Signed)
Office Visit Note   Patient: Michael Morrison           Date of Birth: [birth year]           MRN: [id number] Visit Date: 06/15/2022              Requested by: [place], Betty G, MD [address] [place],  [place] [address] PCP: [place], Betty G, MD  Chief Complaint  Patient presents with   Right Knee - Follow-up   Left Knee - Follow-up      HPI: Patient is a 62 year old gentleman who presents in follow-up for osteoarthritis right knee and left transtibial amputation.  Patient has had temporary relief with previous injections.  Patient is still doing a lot of construction work with flooring and does not want to consider surgery at this time.  Assessment & Plan: Visit Diagnoses:  1. History of left below knee amputation   2. Chronic pain of right knee     Plan: Will call in a prescription for Robaxin and Vicodin 10 mg tablets he will try resuming his Voltaren gel.  Follow-Up Instructions: No follow-ups on file.   Ortho Exam  Patient is alert, oriented, no adenopathy, well-dressed, normal affect, normal respiratory effort. Examination patient has a well-fitting prosthesis on the left.  Right knee has crepitation with range of motion collaterals and cruciates are stable there is varus alignment with weightbearing.  Imaging: No results found. No images are attached to the encounter.  Labs: Lab Results  Component Value Date   HGBA1C 5.8 04/19/2021   ESRSEDRATE 87 (H) 05/22/2007   ESRSEDRATE 52 (H) 05/20/2007   LABURIC 4.1 05/20/2007   REPTSTATUS 04/17/2014 FINAL 04/14/2014   GRAMSTAIN  04/14/2014    MODERATE WBC PRESENT,BOTH PMN AND MONONUCLEAR NO SQUAMOUS EPITHELIAL CELLS SEEN FEW GRAM POSITIVE RODS RARE GRAM POSITIVE COCCI IN PAIRS    CULT  04/14/2014    MULTIPLE ORGANISMS PRESENT, NONE PREDOMINANT Note: NO STAPHYLOCOCCUS AUREUS ISOLATED NO GROUP A STREP (S.PYOGENES) ISOLATED Performed at [facility]    [facility] METHICILLIN RESISTANT STAPHYLOCOCCUS  AUREUS 05/24/2007     Lab Results  Component Value Date   ALBUMIN 4.0 11/23/2010   ALBUMIN 3.8 10/25/2010   ALBUMIN 2.8 (L) 05/23/2007    No results found for: "MG" No results found for: "VD25OH"  No results found for: "PREALBUMIN"    Latest Ref Rng & Units 11/23/2014   12:30 PM 02/03/2011   10:47 AM 02/03/2011    4:47 AM  CBC EXTENDED  WBC 4.0 - 10.5 K/uL 6.1     RBC 4.22 - 5.81 MIL/uL 4.59     Hemoglobin 13.0 - 17.0 g/dL 87.5  64.3  32.9   HCT 39.0 - 52.0 % 41.6  34.3  33.2   Platelets 150 - 400 K/uL PLATELETS APPEAR ADEQUATE     NEUT# 1.7 - 7.7 K/uL 3.2     Lymph# 0.7 - 4.0 K/uL 2.4        There is no height or weight on file to calculate BMI.  Orders:  No orders of the defined types were placed in this encounter.  No orders of the defined types were placed in this encounter.    Procedures: No procedures performed  Clinical Data: No additional findings.  ROS:  All other systems negative, except as noted in the HPI. Review of Systems  Objective: Vital Signs: There were no vitals taken for this visit.  Specialty Comments:  No specialty comments available.  PMFS  History: Patient Active Problem List   Diagnosis Date Noted   History of left below knee amputation 04/20/2021   Hx of bladder cancer 04/19/2021   Tobacco use disorder 11/13/2018   Gastroesophageal reflux disease 11/13/2018   METHICILLIN RESISTANT STAPHYLOCOCCUS AUREUS INFECTION 07/03/2007   BKA, LEFT LEG 07/03/2007   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    Family History  Problem Relation Age of Onset   Diabetes Mother    Arthritis Mother    Cancer Mother    Hyperlipidemia Mother    Hypertension Mother    Arthritis Sister    Diabetes Sister     Arthritis Brother    Cancer Brother    Diabetes Maternal Grandmother    Diabetes Brother    Drug abuse Brother     Past Surgical History:  Procedure Laterality Date   ANKLE SURGERY  2005 & 2008   left ankle surgery--after fall of 40 feet   CYSTOSCOPY W/ URETERAL STENT PLACEMENT  02/02/2011   Procedure: CYSTOSCOPY WITH RETROGRADE PYELOGRAM/URETERAL STENT PLACEMENT;  Surgeon: Crecencio Mc, MD;  Location: [facility];  Service: Urology;  Laterality: Right;  Cystoscopy Right Retrograde Right Ureteral Stent    (c-arm)    I & D EXTREMITY Right 11/23/2014   Procedure: VY FLAP TO RIGHT MIDDLE FINGER;  Surgeon: Dairl Ponder, MD;  Location: [facility];  Service: Orthopedics;  Laterality: Right;   LEG AMPUTATION BELOW KNEE  2009   left below knee amputation -wears prosthesis   ROBOT ASSISTED PYELOPLASTY  02/02/2011   Procedure: ROBOTIC ASSISTED PYELOPLASTY WITH STENT PLACEMENT;  Surgeon: Crecencio Mc, MD;  Location: [facility];  Service: Urology;  Laterality: Right;  Cystoscopy Right Retrograde Right Ureteral Stent    (c-arm)    tur bt  11/21/10   Social History   Occupational History   Not on file  Tobacco Use   Smoking status: Every Day    Packs/day: 0.50    Years: 20.00    Additional pack years: 0.00    Total pack years: 10.00    Types: Cigarettes    Start date: 09/03/1980   Smokeless tobacco: Never  Vaping Use   Vaping Use: Not on file  Substance and Sexual Activity   Alcohol use: Yes   Drug use: No   Sexual activity: Not on file

## 2022-07-03 ENCOUNTER — Telehealth: Payer: Self-pay | Admitting: Orthopedic Surgery

## 2022-07-03 NOTE — Telephone Encounter (Signed)
Patient called. He would like hydrocodone called in for him. His call back number is [phone]

## 2022-07-04 ENCOUNTER — Other Ambulatory Visit: Payer: Self-pay | Admitting: Orthopedic Surgery

## 2022-07-04 MED ORDER — HYDROCODONE-ACETAMINOPHEN 10-325 MG PO TABS: 1.0000 | ORAL_TABLET | Freq: Four times a day (QID) | ORAL | 0 refills | Status: DC | PRN

## 2022-07-04 NOTE — Telephone Encounter (Signed)
Pt last in office 06/15/2022 give rx for Hydrocodone #30 right knee OA and hx  left BKA please advise.

## 2022-07-14 ENCOUNTER — Telehealth: Payer: Self-pay

## 2022-07-14 DIAGNOSIS — Z0000 Encounter for general adult medical examination without abnormal findings: Secondary | ICD-10-CM

## 2022-07-14 NOTE — Telephone Encounter (Signed)
Contacted patient on preferred number listed in notes for scheduled AWV. Patient stated  unable to complete visit at this time will call back and reschedule. 

## 2022-07-14 NOTE — Progress Notes (Signed)
Subjective:   Michael Morrison is a 62 y.o. male who presents for Medicare Annual/Subsequent preventive examination.  Review of Systems           Objective:    Today's Vitals   There is no height or weight on file to calculate BMI.     07/12/2021    9:59 AM 11/23/2014   11:28 AM 02/02/2011    3:35 PM 01/30/2011    9:53 AM  Advanced Directives  Does Patient Have a Medical Advance Directive? Yes No Patient has advance directive, copy not in chart Patient has advance directive, copy not in chart  Type of Advance Directive Healthcare Power of [place];Living will  Healthcare Power of [place];Living will Healthcare Power of [place];Living will  Does patient want to make changes to medical advance directive? No - Patient declined     Copy of Healthcare Power of Attorney in Chart? No - copy requested     Would patient like information on creating a medical advance directive?  No - patient declined information    Pre-existing out of facility DNR order (yellow form or pink MOST form)   No     Current Medications (verified) Outpatient Encounter Medications as of 07/14/2022  Medication Sig   HYDROcodone-acetaminophen (NORCO) 10-325 MG tablet Take 1 tablet by mouth every 6 (six) hours as needed.   HYDROcodone-acetaminophen (NORCO/VICODIN) 5-325 MG tablet Take 1 tablet by mouth every 6 (six) hours as needed for moderate pain.   methocarbamol (ROBAXIN) 500 MG tablet Take 1 tablet (500 mg total) by mouth every 8 (eight) hours as needed for muscle spasms.   omeprazole (PRILOSEC) 40 MG capsule TAKE 1 CAPSULE(40 MG) BY MOUTH DAILY   rosuvastatin (CRESTOR) 10 MG tablet Take 1 tablet (10 mg total) by mouth daily.   Zoster Vaccine Adjuvanted [facility]) injection 0.5 ml in muscle and repeat in 8 weeks   No facility-administered encounter medications on file as of 07/14/2022.    Allergies (verified) Other   History: Past Medical History:  Diagnosis Date   Amputation of leg below knee, left,  traumatic, complicated t   HX OF MRSA INFECTION LEFT ANKLE AFTER INJURY AND MULTIPLE SURGERIES   GERD (gastroesophageal reflux disease)    Ureteral obstruction    right ureteropelvic junction obstruction   Past Surgical History:  Procedure Laterality Date   ANKLE SURGERY  2005 & 2008   left ankle surgery--after fall of 40 feet   CYSTOSCOPY W/ URETERAL STENT PLACEMENT  02/02/2011   Procedure: CYSTOSCOPY WITH RETROGRADE PYELOGRAM/URETERAL STENT PLACEMENT;  Surgeon: Crecencio Mc, MD;  Location[facility];  Service: Urology;  Laterality: Right;  Cystoscopy Right Retrograde Right Ureteral Stent    (c-arm)    I & D EXTREMITY Right 11/23/2014   Procedure: VY FLAP TO RIGHT MIDDLE FINGER;  Surgeon: Dairl Ponder, MD;  Location: [facility];  Service: Orthopedics;  Laterality: Right;   LEG AMPUTATION BELOW KNEE  2009   left below knee amputation -wears prosthesis   ROBOT ASSISTED PYELOPLASTY  02/02/2011   Procedure: ROBOTIC ASSISTED PYELOPLASTY WITH STENT PLACEMENT;  Surgeon: Crecencio Mc, MD;  Location: [facility];  Service: Urology;  Laterality: Right;  Cystoscopy Right Retrograde Right Ureteral Stent    (c-arm)    tur bt  11/21/10   Family History  Problem Relation Age of Onset   Diabetes Mother    Arthritis Mother    Cancer Mother    Hyperlipidemia Mother    Hypertension Mother    Arthritis Sister  Diabetes Sister    Arthritis Brother    Cancer Brother    Diabetes Maternal Grandmother    Diabetes Brother    Drug abuse Brother    Social History   Socioeconomic History   Marital status: Divorced    Spouse name: Not on file   Number of children: Not on file   Years of education: Not on file   Highest education level: Not on file  Occupational History   Not on file  Tobacco Use   Smoking status: Every Day    Packs/day: 0.50    Years: 20.00    Additional pack years: 0.00    Total pack years: 10.00    Types: Cigarettes    Start date: 09/03/1980   Smokeless tobacco: Never  Vaping Use   Vaping  Use: Not on file  Substance and Sexual Activity   Alcohol use: Yes   Drug use: No   Sexual activity: Not on file  Other Topics Concern   Not on file  Social History Narrative   Not on file   Social Determinants of Health   Financial Resource Strain: Low Risk  (07/12/2021)   Overall Financial Resource Strain (CARDIA)    Difficulty of Paying Living Expenses: Not hard at all  Food Insecurity: No Food Insecurity (07/12/2021)   Hunger Vital Sign    Worried About Running Out of Food in the Last Year: Never true    Ran Out of Food in the Last Year: Never true  Transportation Needs: No Transportation Needs (07/12/2021)   PRAPARE - Administrator, Civil Service (Medical): No    Lack of Transportation (Non-Medical): No  Physical Activity: Insufficiently Active (07/12/2021)   Exercise Vital Sign    Days of Exercise per Week: 3 days    Minutes of Exercise per Session: 30 min  Stress: No Stress Concern Present (07/12/2021)   [employer] of Occupational Health - Occupational Stress Questionnaire    Feeling of Stress : Not at all  Social Connections: Moderately Isolated (07/12/2021)   Social Connection and Isolation Panel [NHANES]    Frequency of Communication with Friends and Family: More than three times a week    Frequency of Social Gatherings with Friends and Family: More than three times a week    Attends Religious Services: Never    Database administrator or Organizations: No    Attends Banker Meetings: Never    Marital Status: Married    Tobacco Counseling Ready to quit: Not Answered Counseling given: Not Answered   Clinical Intake:                 Diabetic?          Activities of Daily Living     No data to display          Patient Care Team: [place], Betty G, MD as PCP - General (Family Medicine)  Indicate any recent Medical Services you may have received from other than [facility] providers in the past year (date may be  approximate).     Assessment:   This is a routine wellness examination for Michael Morrison.  Hearing/Vision screen No results found.  Dietary issues and exercise activities discussed:     Goals Addressed   None    Depression Screen    07/12/2021    9:56 AM 04/19/2021    9:33 AM 11/13/2018    6:32 PM  PHQ 2/9 Scores  PHQ - 2 Score 0 0 0  Fall Risk    07/12/2021    9:58 AM 11/13/2018    6:32 PM  Fall Risk   Falls in the past year? 0 0  Number falls in past yr: 0 0  Injury with Fall? 0 0  Risk for fall due to : No Fall Risks     FALL RISK PREVENTION PERTAINING TO THE HOME:  Any stairs in or around the home?  If so, are there any without handrails?  Home free of loose throw rugs in walkways, pet beds, electrical cords, etc?  Adequate lighting in your home to reduce risk of falls?   ASSISTIVE DEVICES UTILIZED TO PREVENT FALLS:  Life alert?  Use of a cane, walker or w/c?  Grab bars in the bathroom?  Shower chair or bench in shower?  Elevated toilet seat or a handicapped toilet?   TIMED UP AND GO:  Was the test performed? .  Length of time to ambulate 10 feet:  sec.     Cognitive Function:        07/12/2021   10:00 AM  6CIT Screen  What Year? 0 points  What month? 0 points  What time? 0 points  Count back from 20 0 points  Months in reverse 0 points  Repeat phrase 0 points  Total Score 0 points    Immunizations Immunization History  Administered Date(s) Administered   Influenza,inj,Quad PF,6+ Mos 11/13/2018   Moderna Sars-Covid-2 Vaccination 05/15/2019, 06/17/2019   Pneumococcal Polysaccharide-23 04/19/2021   Tdap 11/23/2014            Qualifies for Shingles Vaccine?   Zostavax    Screening Tests Health Maintenance  Topic Date Due   HIV Screening  Never done   Zoster Vaccines- Shingrix (1 of 2) Never done   INFLUENZA VACCINE  09/28/2022   Medicare Annual Wellness (AWV)  07/14/2023   DTaP/Tdap/Td (2 - Td or Tdap) 11/22/2024    COLONOSCOPY (Pts 45-88yrs Insurance coverage will need to be confirmed)  08/24/2025   Hepatitis C Screening  Completed   HPV VACCINES  Aged Out   COVID-19 Vaccine  Discontinued    Health Maintenance  Health Maintenance Due  Topic Date Due   HIV Screening  Never done   Zoster Vaccines- Shingrix (1 of 2) Never done      Lung Cancer Screening: (Low Dose CT Chest recommended if Age 25-80 years, 30 pack-year currently smoking OR have quit w/in 15years.)  qualify.   Lung Cancer Screening Referral:   Additional Screening:  Hepatitis C Screening:  qualify; Completed   Vision Screening: Recommended annual ophthalmology exams for early detection of glaucoma and other disorders of the eye. Is the patient up to date with their annual eye exam?   Who is the provider or what is the name of the office in which the patient attends annual eye exams?  If pt is not established with a provider, would they like to be referred to a provider to establish care? .   Dental Screening: Recommended annual dental exams for proper oral hygiene  Community Resource Referral / Chronic Care Management: CRR required this visit?    CCM required this visit?       Plan:     I have personally reviewed and noted the following in the patient's chart:   Medical and social history Use of alcohol, tobacco or illicit drugs  Current medications and supplements including opioid prescriptions.  Functional ability and status Nutritional status Physical activity Advanced directives List of other  physicians Hospitalizations, surgeries, and ER visits in previous 12 months Vitals Screenings to include cognitive, depression, and falls Referrals and appointments  In addition, I have reviewed and discussed with patient certain preventive protocols, quality metrics, and best practice recommendations. A written personalized care plan for preventive services as well as general preventive health recommendations were  provided to patient.     Tillie Rung, LPN   1/[id number]/[id number]   Nurse Notes: Patient stated unable to complete visit at this time will call back to reschedule.   This encounter was created in error - please disregard.

## 2022-07-25 ENCOUNTER — Ambulatory Visit: Payer: No Typology Code available for payment source | Admitting: Orthopedic Surgery

## 2022-12-04 ENCOUNTER — Ambulatory Visit: Payer: No Typology Code available for payment source | Admitting: Orthopedic Surgery

## 2023-01-04 ENCOUNTER — Encounter: Payer: Self-pay | Admitting: Orthopedic Surgery

## 2023-01-04 ENCOUNTER — Ambulatory Visit: Payer: No Typology Code available for payment source | Admitting: Orthopedic Surgery

## 2023-01-04 DIAGNOSIS — Z89512 Acquired absence of left leg below knee: Secondary | ICD-10-CM | POA: Diagnosis not present

## 2023-01-04 NOTE — Progress Notes (Signed)
Office Visit Note   Patient: Michael Morrison           Date of Birth: February 17, 1961           MRN: [id number] Visit Date: 01/04/2023              Requested by: [place], Betty G, MD [address] [place],  [place] [address] PCP: [place], Betty G, MD  Chief Complaint  Patient presents with   Left Leg - Follow-up      HPI: Patient is a 62 year old gentleman with a left transtibial amputation.  Patient states she has lost weight and has decreased residual volume in the left leg.  He is subsiding into the socket causing and bearing pain and has broken the foot and ankle.  Assessment & Plan: Visit Diagnoses:  1. History of left below knee amputation (HCC)     Plan: Patient is provided a prescription for a new socket, new liner, new foot and ankle, and supplies.  Follow-Up Instructions: Return if symptoms worsen or fail to improve.   Ortho Exam  Patient is alert, oriented, no adenopathy, well-dressed, normal affect, normal respiratory effort. Examination patient's liner is torn he is subsiding into the socket.  He has an bearing pain over the fibula but no cellulitis no open ulcers.  The foot and ankle are broken.  Patient is wearing over 15 ply sock.  Patient is an existing left transtibial  amputee.  Patient's current comorbidities are not expected to impact the ability to function with the prescribed prosthesis. Patient verbally communicates a strong desire to use a prosthesis. Patient currently requires mobility aids to ambulate without a prosthesis.  Expects not to use mobility aids with a new prosthesis.  Patient is a K3 level ambulator that spends a lot of time walking around on uneven terrain over obstacles, up and down stairs, and ambulates with a variable cadence.     Imaging: No results found. No images are attached to the encounter.  Labs: Lab Results  Component Value Date   HGBA1C 5.8 04/19/2021   ESRSEDRATE 87 (H) 05/22/2007   ESRSEDRATE 52 (H)  05/20/2007   LABURIC 4.1 05/20/2007   REPTSTATUS 04/17/2014 FINAL 04/14/2014   GRAMSTAIN  04/14/2014    MODERATE WBC PRESENT,BOTH PMN AND MONONUCLEAR NO SQUAMOUS EPITHELIAL CELLS SEEN FEW GRAM POSITIVE RODS RARE GRAM POSITIVE COCCI IN PAIRS    CULT  04/14/2014    MULTIPLE ORGANISMS PRESENT, NONE PREDOMINANT Note: NO STAPHYLOCOCCUS AUREUS ISOLATED NO GROUP A STREP (S.PYOGENES) ISOLATED Performed at [facility]    LABORGA METHICILLIN RESISTANT STAPHYLOCOCCUS AUREUS 05/24/2007     Lab Results  Component Value Date   ALBUMIN 4.0 11/23/2010   ALBUMIN 3.8 10/25/2010   ALBUMIN 2.8 (L) 05/23/2007    No results found for: "MG" No results found for: "VD25OH"  No results found for: "PREALBUMIN"    Latest Ref Rng & Units 11/23/2014   12:30 PM 02/03/2011   10:47 AM 02/03/2011    4:47 AM  CBC EXTENDED  WBC 4.0 - 10.5 K/uL 6.1     RBC 4.22 - 5.81 MIL/uL 4.59     Hemoglobin 13.0 - 17.0 g/dL 03.4  74.2  59.5   HCT 39.0 - 52.0 % 41.6  34.3  33.2   Platelets 150 - 400 K/uL PLATELETS APPEAR ADEQUATE     NEUT# 1.7 - 7.7 K/uL 3.2     Lymph# 0.7 - 4.0 K/uL 2.4        There  is no height or weight on file to calculate BMI.  Orders:  No orders of the defined types were placed in this encounter.  No orders of the defined types were placed in this encounter.    Procedures: No procedures performed  Clinical Data: No additional findings.  ROS:  All other systems negative, except as noted in the HPI. Review of Systems  Objective: Vital Signs: There were no vitals taken for this visit.  Specialty Comments:  No specialty comments available.  PMFS History: Patient Active Problem List   Diagnosis Date Noted   History of left below knee amputation (HCC) 04/20/2021   Hx of bladder cancer 04/19/2021   Tobacco use disorder 11/13/2018   Gastroesophageal reflux disease 11/13/2018   METHICILLIN RESISTANT STAPHYLOCOCCUS AUREUS INFECTION 07/03/2007   BKA, LEFT LEG 07/03/2007    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    Family History  Problem Relation Age of Onset   Diabetes Mother    Arthritis Mother    Cancer Mother    Hyperlipidemia Mother    Hypertension Mother    Arthritis Sister    Diabetes Sister    Arthritis Brother    Cancer Brother    Diabetes Maternal Grandmother    Diabetes Brother    Drug abuse Brother     Past Surgical History:  Procedure Laterality Date   ANKLE SURGERY  2005 & 2008   left ankle surgery--after fall of 40 feet   CYSTOSCOPY W/ URETERAL STENT PLACEMENT  02/02/2011   Procedure: CYSTOSCOPY WITH RETROGRADE PYELOGRAM/URETERAL STENT PLACEMENT;  Surgeon: Crecencio Mc, MD;  Location: [facility];  Service: Urology;  Laterality: Right;  Cystoscopy Right Retrograde Right Ureteral Stent    (c-arm)    I & D EXTREMITY Right 11/23/2014   Procedure: VY FLAP TO RIGHT MIDDLE FINGER;  Surgeon: Dairl Ponder, MD;  Location: [facility];  Service: Orthopedics;  Laterality: Right;   LEG AMPUTATION BELOW KNEE  2009   left below knee amputation -wears prosthesis   ROBOT ASSISTED PYELOPLASTY  02/02/2011   Procedure: ROBOTIC ASSISTED PYELOPLASTY WITH STENT PLACEMENT;  Surgeon: Crecencio Mc, MD;  Location: [facility];  Service: Urology;  Laterality: Right;  Cystoscopy Right Retrograde Right Ureteral Stent    (c-arm)    tur bt  11/21/10   Social History   Occupational History   Not on file  Tobacco Use   Smoking status: Every Day    Current packs/day: 0.50    Average packs/day: 0.5 packs/day for 42.3 years (21.2 ttl pk-yrs)    Types: Cigarettes    Start date: 09/03/1980   Smokeless tobacco: Never  Vaping Use   Vaping status: Not on file  Substance and Sexual Activity   Alcohol use: Yes   Drug use: No   Sexual  activity: Not on file

## 2023-02-12 DIAGNOSIS — Z0100 Encounter for examination of eyes and vision without abnormal findings: Secondary | ICD-10-CM | POA: Diagnosis not present

## 2023-02-12 DIAGNOSIS — H5211 Myopia, right eye: Secondary | ICD-10-CM | POA: Diagnosis not present

## 2023-02-20 DIAGNOSIS — Z89512 Acquired absence of left leg below knee: Secondary | ICD-10-CM | POA: Diagnosis not present

## 2023-04-03 ENCOUNTER — Ambulatory Visit (INDEPENDENT_AMBULATORY_CARE_PROVIDER_SITE_OTHER): Payer: No Typology Code available for payment source | Admitting: Podiatry

## 2023-04-03 DIAGNOSIS — Z91199 Patient's noncompliance with other medical treatment and regimen due to unspecified reason: Secondary | ICD-10-CM

## 2023-04-03 NOTE — Progress Notes (Signed)
 Patient was no-show for appointment today

## 2023-04-06 ENCOUNTER — Encounter: Payer: Self-pay | Admitting: Podiatry

## 2023-04-19 ENCOUNTER — Telehealth: Payer: No Typology Code available for payment source | Admitting: Family Medicine

## 2023-04-19 ENCOUNTER — Ambulatory Visit: Payer: No Typology Code available for payment source | Admitting: Podiatry

## 2023-04-19 ENCOUNTER — Encounter: Payer: Self-pay | Admitting: Family Medicine

## 2023-04-19 VITALS — BP 143/89

## 2023-04-19 DIAGNOSIS — F17210 Nicotine dependence, cigarettes, uncomplicated: Secondary | ICD-10-CM

## 2023-04-19 DIAGNOSIS — Z720 Tobacco use: Secondary | ICD-10-CM | POA: Diagnosis not present

## 2023-04-19 DIAGNOSIS — U071 COVID-19: Secondary | ICD-10-CM | POA: Diagnosis not present

## 2023-04-19 NOTE — Progress Notes (Signed)
Virtual Visit via Video Note  I connected with Michael Morrison on 04/19/23 at 10:30 AM EST by a video enabled telemedicine application and verified that I am speaking with the correct person using two identifiers.  Location patient: home Location provider:work or home office Persons participating in the virtual visit: patient, provider, pt's wife.  I discussed the limitations of evaluation and management by telemedicine and the availability of in person appointments. The patient expressed understanding and agreed to proceed.  Chief Complaint  Patient presents with   Covid Exposure    Started 4 days ago, tested pos wed, body aches, stomach ache, Nausea    HPI:  Pt is a 63 yo male followed by Dr. [place] who was seen for acute concern. Pt felt weak 4 days ago.  Pt's wife started feeling sick and tested positive positive for COVID after being seen in clinic.  Patient had a positive at-home COVID test yesterday.  Endorses occasional cough, rare rhinorrhea. Denies fevers, changes in appetite, diarrhea, n/v, loss of taste or smell.  Pt smoking 4-5 cigarettes/day.  States has cut down as was smoking almost a pack per day.  ROS: See pertinent positives and negatives per HPI.  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    Past Surgical History:  Procedure Laterality Date   ANKLE SURGERY  2005 & 2008   left ankle surgery--after fall of 40 feet   CYSTOSCOPY W/ URETERAL STENT PLACEMENT  02/02/2011   Procedure: CYSTOSCOPY WITH RETROGRADE PYELOGRAM/URETERAL STENT PLACEMENT;  Surgeon: Crecencio Mc, MD;  Location: [facility];  Service: Urology;  Laterality: Right;  Cystoscopy Right Retrograde Right Ureteral Stent    (c-arm)    I & D EXTREMITY Right 11/23/2014   Procedure:  VY FLAP TO RIGHT MIDDLE FINGER;  Surgeon: Dairl Ponder, MD;  Location: [facility];  Service: Orthopedics;  Laterality: Right;   LEG AMPUTATION BELOW KNEE  2009   left below knee amputation -wears prosthesis   ROBOT ASSISTED PYELOPLASTY  02/02/2011   Procedure: ROBOTIC ASSISTED PYELOPLASTY WITH STENT PLACEMENT;  Surgeon: Crecencio Mc, MD;  Location: [facility];  Service: Urology;  Laterality: Right;  Cystoscopy Right Retrograde Right Ureteral Stent    (c-arm)    tur bt  11/21/10    Family History  Problem Relation Age of Onset   Diabetes Mother    Arthritis Mother    Cancer Mother    Hyperlipidemia Mother    Hypertension Mother    Arthritis Sister    Diabetes Sister    Arthritis Brother    Cancer Brother    Diabetes Maternal Grandmother    Diabetes Brother    Drug abuse Brother     Current Outpatient Medications:    HYDROcodone-acetaminophen (NORCO/VICODIN) 5-325 MG tablet, Take 1 tablet by mouth every 6 (six) hours as needed for moderate pain., Disp: 20 tablet, Rfl: 0   methocarbamol (ROBAXIN) 500 MG tablet, Take 1 tablet (500 mg total) by mouth every 8 (eight) hours as needed for muscle spasms., Disp: 30 tablet, Rfl: 0   rosuvastatin (CRESTOR) 10 MG tablet, Take 1 tablet (10 mg total) by mouth daily., Disp: 90 tablet, Rfl: 3  EXAM:  VITALS per patient if applicable:  RR between 12-20 bpm, BP 143/89  GENERAL: alert, oriented, appears  well and in no acute distress.  Seen finishing smoking a cigarette at start of video visit.  HEENT: atraumatic, conjunctiva clear, no obvious abnormalities on inspection of external nose and ears  NECK: normal movements of the head and neck  LUNGS: on inspection no signs of respiratory distress, breathing rate appears normal, no obvious gross SOB, gasping or wheezing  CV: no obvious cyanosis  MS: moves all visible extremities without noticeable abnormality  PSYCH/NEURO: pleasant and cooperative, no obvious depression or anxiety, speech and thought  processing grossly intact  ASSESSMENT AND PLAN:  Discussed the following assessment and plan:  COVID-19 virus infection  Nicotine use  Patient with fatigue and rare rhinorrhea x 4 days, with positive at-home COVID test yesterday 04/18/2023.  As patient with mild symptoms discussed continuing OTC medications as needed, rest, hydration.  Advised outside of window for antiviral treatment.  Given strict precautions.  Smoking cessation greater than 3 minutes, less than 10 minutes.  Currently smoking 4-5 cigarettes/day.  Discussed the importance of continuing to cut down.  Consider quit aids.  Continue to assess at each visit.  Follow-up as needed   I discussed the assessment and treatment plan with the patient. The patient was provided an opportunity to ask questions and all were answered. The patient agreed with the plan and demonstrated an understanding of the instructions.   The patient was advised to call back or seek an in-person evaluation if the symptoms worsen or if the condition fails to improve as anticipated.   Deeann Saint, MD

## 2023-04-24 ENCOUNTER — Ambulatory Visit: Payer: No Typology Code available for payment source | Attending: Family Medicine

## 2023-04-24 ENCOUNTER — Ambulatory Visit (INDEPENDENT_AMBULATORY_CARE_PROVIDER_SITE_OTHER): Payer: No Typology Code available for payment source | Admitting: Family Medicine

## 2023-04-24 ENCOUNTER — Ambulatory Visit: Payer: Self-pay | Admitting: Family Medicine

## 2023-04-24 VITALS — BP 138/62 | HR 75 | Temp 97.9°F | Ht 72.0 in | Wt 204.4 lb

## 2023-04-24 DIAGNOSIS — U071 COVID-19: Secondary | ICD-10-CM | POA: Diagnosis not present

## 2023-04-24 DIAGNOSIS — R002 Palpitations: Secondary | ICD-10-CM | POA: Diagnosis not present

## 2023-04-24 NOTE — Progress Notes (Unsigned)
 EP to read.

## 2023-04-24 NOTE — Telephone Encounter (Signed)
 Noted.

## 2023-04-24 NOTE — Telephone Encounter (Signed)
 Chief Complaint: Palpitations/Covid Symptoms: Palpitations Frequency: last few days Pertinent Negatives: Patient denies dizziness Disposition: [] ED /[] Urgent Care (no appt availability in office) / [x] Appointment(In office/virtual)/ []  [facility]/ [] Home Care/ [] Refused Recommended Disposition /[] [facility]/ []  Follow-up with PCP Additional Notes: Patient called in stating he was diagnosed with covid and had a telemedicine visit on February 20th, but patient states he is still feeling uneasy with the feeling of palpitations and irregular heartbeat. Patient denies any prior cardiac history, but states he feels his heart keeps speeding up faster than normal and is feeling very fatigued/weak. Patient took BP while on the phone and BP monitor showed BP of 138/62 and heartrate of 64. Patient denies being on any current medication. Patient appt made for today for further evaluation.    Copied from CRM [id number]. Topic: Clinical - Red Word Triage >> Apr 24, 2023 10:33 AM Orinda Kenner C wrote: Red Word that prompted transfer to Nurse Triage: Patient's spouse Maxine Glenn has Covid, and patient is having breath issue, feels different hard to explain like an anxiety, heart rate faster than normal, and fatigue. Patient denies dizziness, wheezing, pain, numbness, nor fever. Patient wantst to be seen. Please advise [phone] Reason for Disposition  [1] HIGH RISK patient (e.g., weak immune system, age > 64 years, obesity with BMI 30 or higher, pregnant, chronic lung disease or other chronic medical condition) AND [2] COVID symptoms (e.g., cough, fever)  (Exceptions: Already seen by PCP and no new or worsening symptoms.)  Answer Assessment - Initial Assessment Questions 1. COVID-19 DIAGNOSIS: "How do you know that you have COVID?" (e.g., positive lab test or self-test, diagnosed by doctor or NP/PA, symptoms after exposure).     Telemedicine visit on 20th - tested positive for covid 2. COVID-19  EXPOSURE: "Was there any known exposure to COVID before the symptoms began?" CDC Definition of close contact: within 6 feet (2 meters) for a total of 15 minutes or more over a 24-hour period.     Patient and wife both positive 3. ONSET: "When did the COVID-19 symptoms start?"      19th  4. WORST SYMPTOM: "What is your worst symptom?" (e.g., cough, fever, shortness of breath, muscle aches)     Heartbeat feels off   5. COUGH: "Do you have a cough?" If Yes, ask: "How bad is the cough?"       Occasionally  6. FEVER: "Do you have a fever?" If Yes, ask: "What is your temperature, how was it measured, and when did it start?"     No 7. RESPIRATORY STATUS: "Describe your breathing?" (e.g., normal; shortness of breath, wheezing, unable to speak)      No breathing difficulty 8. BETTER-SAME-WORSE: "Are you getting better, staying the same or getting worse compared to yesterday?"  If getting worse, ask, "In what way?"     Feel better symptom wise 9. OTHER SYMPTOMS: "Do you have any other symptoms?"  (e.g., chills, fatigue, headache, loss of smell or taste, muscle pain, sore throat)     Chest congestion, nose congestion, weak 10. HIGH RISK DISEASE: "Do you have any chronic medical problems?" (e.g., asthma, heart or lung disease, weak immune system, obesity, etc.)       No 11. VACCINE: "Have you had the COVID-19 vaccine?" If Yes, ask: "Which one, how many shots, when did you get it?"       Yes, Moderna 13. O2 SATURATION MONITOR:  "Do you use an oxygen saturation monitor (pulse oximeter) at home?" If  Yes, ask "What is your reading (oxygen level) today?" "What is your usual oxygen saturation reading?" (e.g., 95%)       N/a -  Protocols used: Coronavirus (COVID-19) Diagnosed or Suspected-A-AH

## 2023-04-24 NOTE — Progress Notes (Signed)
 Established Patient Office Visit  Subjective   Patient ID: Michael Morrison, male    DOB: 07/10/1960  Age: 63 y.o. MRN: [id number]  Chief Complaint  Patient presents with   Palpitations    Pt is with wife. Pt c/o his "breathing is different". "Feels rush". Pt reports sx started before tested positive for covid on 2/20-Wednesday 2/19.     HPI   Michael Morrison is seen with intermittent palpitations for several weeks.  No clear provoking factors.  He actually did test positive for COVID 5 days ago but states his palpitations preceded that.  No associated dizziness, syncope, or chest pain.  Drinks fair amount of coffee at baseline.  No decongestants.  No recent alcohol use.  No known cardiac history.  He does smoke about a pack of cigarettes per day.  Past history of left below-knee amputation secondary to trauma.  No history of diabetes.  Denies any exertional dyspnea.  COVID symptoms have been relatively mild.  No fever past couple days.  Only very mild cough.  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   Past Surgical History:  Procedure Laterality Date   ANKLE SURGERY  2005 & 2008   left ankle surgery--after fall of 40 feet   CYSTOSCOPY W/ URETERAL STENT PLACEMENT  02/02/2011   Procedure: CYSTOSCOPY WITH RETROGRADE PYELOGRAM/URETERAL STENT PLACEMENT;  Surgeon: Crecencio Mc, MD;  Location: [facility];  Service: Urology;  Laterality: Right;  Cystoscopy Right Retrograde Right Ureteral Stent    (c-arm)    I & D EXTREMITY Right 11/23/2014   Procedure: VY FLAP TO RIGHT MIDDLE FINGER;  Surgeon: Dairl Ponder, MD;  Location: [facility];  Service: Orthopedics;  Laterality: Right;   LEG AMPUTATION BELOW KNEE  2009   left below knee amputation -wears prosthesis   ROBOT  ASSISTED PYELOPLASTY  02/02/2011   Procedure: ROBOTIC ASSISTED PYELOPLASTY WITH STENT PLACEMENT;  Surgeon: Crecencio Mc, MD;  Location: [facility];  Service: Urology;  Laterality: Right;  Cystoscopy Right Retrograde Right Ureteral Stent    (c-arm)    tur bt  11/21/10    reports that he has been smoking cigarettes. He started smoking about 42 years ago. He has a 21.3 pack-year smoking history. He has never used smokeless tobacco. He reports current alcohol use. He reports that he does not use drugs. family history includes Arthritis in his brother, mother, and sister; Cancer in his brother and mother; Diabetes in his brother, maternal grandmother, mother, and sister; Drug abuse in his brother; Hyperlipidemia in his mother; Hypertension in his mother. Allergies  Allergen Reactions   Other     Review of Systems  Constitutional:  Negative for chills, fever and malaise/fatigue.  Eyes:  Negative for blurred vision.  Respiratory:  Negative for shortness of breath.   Cardiovascular:  Positive for palpitations. Negative for chest pain.  Neurological:  Negative for dizziness, weakness and headaches.      Objective:     BP 138/62 (BP Location: Right Arm, Patient Position: Sitting, Cuff Size: Large)   Pulse 75   Temp 97.9 F (36.6 C) (Oral)   Ht 6' (1.829 m)   Wt 204 lb 6.4 oz (92.7 kg)   SpO2 97%   BMI 27.72 kg/m  BP Readings from Last  3 Encounters:  04/24/23 138/62  04/19/23 (S) (!) 143/89  04/19/21 130/80   Wt Readings from Last 3 Encounters:  04/24/23 204 lb 6.4 oz (92.7 kg)  07/12/21 215 lb (97.5 kg)  04/19/21 215 lb 2 oz (97.6 kg)      Physical Exam Vitals reviewed.  Constitutional:      General: He is not in acute distress.    Appearance: He is well-developed. He is not ill-appearing.  HENT:     Right Ear: External ear normal.     Left Ear: External ear normal.  Eyes:     Pupils: Pupils are equal, round, and reactive to light.  Neck:     Thyroid: No thyromegaly.   Cardiovascular:     Rate and Rhythm: Normal rate.     Heart sounds: No murmur heard.    Comments: Only occasional irregular beat Pulmonary:     Effort: Pulmonary effort is normal. No respiratory distress.     Breath sounds: Normal breath sounds. No wheezing or rales.  Musculoskeletal:     Cervical back: Neck supple.     Right lower leg: No edema.     Left lower leg: No edema.  Neurological:     Mental Status: He is alert and oriented to person, place, and time.      No results found for any visits on 04/24/23.    The 10-year ASCVD risk score (Arnett DK, et al., 2019) is: 17.8%    Assessment & Plan:   Problem List Items Addressed This Visit   None Visit Diagnoses       Palpitations    -  Primary   Relevant Orders   EKG 12-Lead   Basic metabolic panel   TSH   Magnesium     Patient has COVID diagnosis which was diagnosed by home test 5 days ago.  Relatively mild illness with no acute concerning symptoms.  Presents today with several week history of intermittent palpitations.  No associated syncope, dizziness, or chest pain.  Only occasional irregular beat on exam.  Suspect probably PACs or PVCs.  -Gradually reduce caffeine -Check EKG-shows sinus rhythm with no acute ST-T changes -Check labs with basic metabolic panel, TSH, magnesium -Consider home Zio patch -COVID symptoms are very mild and he is past the window for antiviral therapy.  Continued observation.  Follow-up promptly for any fever or other concerns  No follow-ups on file.    Evelena Peat, MD

## 2023-04-25 LAB — BASIC METABOLIC PANEL
BUN: 12 mg/dL (ref 6–23)
CO2: 25 meq/L (ref 19–32)
Calcium: 9.2 mg/dL (ref 8.4–10.5)
Chloride: 105 meq/L (ref 96–112)
Creatinine, Ser: 0.87 mg/dL (ref 0.40–1.50)
GFR: 91.1 mL/min (ref >60.00)
Glucose, Bld: 73 mg/dL (ref 70–99)
Potassium: 4.3 meq/L (ref 3.5–5.1)
Sodium: 139 meq/L (ref 135–145)

## 2023-04-25 LAB — MAGNESIUM: Magnesium: 2 mg/dL (ref 1.5–2.5)

## 2023-04-25 LAB — TSH: TSH: 1.29 u[IU]/mL (ref 0.35–5.50)

## 2023-04-26 ENCOUNTER — Ambulatory Visit: Payer: No Typology Code available for payment source | Admitting: Podiatry

## 2023-04-30 ENCOUNTER — Ambulatory Visit (INDEPENDENT_AMBULATORY_CARE_PROVIDER_SITE_OTHER): Payer: No Typology Code available for payment source | Admitting: Orthopedic Surgery

## 2023-04-30 DIAGNOSIS — M25561 Pain in right knee: Secondary | ICD-10-CM

## 2023-04-30 DIAGNOSIS — G8929 Other chronic pain: Secondary | ICD-10-CM

## 2023-05-01 ENCOUNTER — Encounter: Payer: Self-pay | Admitting: Orthopedic Surgery

## 2023-05-01 NOTE — Progress Notes (Signed)
 Office Visit Note   Patient: Grace Bushy           Date of Birth: 20-Jun-1960           MRN: [id number] Visit Date: 04/30/2023              Requested by: [place], Betty G, MD [address] [place],  [place] [address] PCP: [place], Betty G, MD  Chief Complaint  Patient presents with   Right Foot - Follow-up      HPI: Patient is a 63 year old gentleman who is active in [employer].  He has a left below-knee prosthesis and is having pain with callus over the base of the fifth metatarsal right foot.  Assessment & Plan: Visit Diagnoses:  1. Chronic pain of right knee     Plan: Callus was pared patient has good supportive shoe wear.  Follow-up as needed.  Follow-Up Instructions: Return if symptoms worsen or fail to improve.   Ortho Exam  Patient is alert, oriented, no adenopathy, well-dressed, normal affect, normal respiratory effort. Examination patient has no cellulitis no open ulcers on the right foot.  Patient has a hypertrophic callus over the base of the fifth metatarsal.  After informed consent a 10 blade knife was used to pare the callus.  There is no open wound no signs of infection.  Imaging: No results found. No images are attached to the encounter.  Labs: Lab Results  Component Value Date   HGBA1C 5.8 04/19/2021   ESRSEDRATE 87 (H) 05/22/2007   ESRSEDRATE 52 (H) 05/20/2007   LABURIC 4.1 05/20/2007   REPTSTATUS 04/17/2014 FINAL 04/14/2014   GRAMSTAIN  04/14/2014    MODERATE WBC PRESENT,BOTH PMN AND MONONUCLEAR NO SQUAMOUS EPITHELIAL CELLS SEEN FEW GRAM POSITIVE RODS RARE GRAM POSITIVE COCCI IN PAIRS    CULT  04/14/2014    MULTIPLE ORGANISMS PRESENT, NONE PREDOMINANT Note: NO STAPHYLOCOCCUS AUREUS ISOLATED NO GROUP A STREP (S.PYOGENES) ISOLATED Performed at [facility]    LABORGA METHICILLIN RESISTANT STAPHYLOCOCCUS AUREUS 05/24/2007     Lab Results  Component Value Date   ALBUMIN 4.0 11/23/2010   ALBUMIN 3.8 10/25/2010   ALBUMIN  2.8 (L) 05/23/2007    Lab Results  Component Value Date   MG 2.0 04/24/2023   No results found for: "VD25OH"  No results found for: "PREALBUMIN"    Latest Ref Rng & Units 11/23/2014   12:30 PM 02/03/2011   10:47 AM 02/03/2011    4:47 AM  CBC EXTENDED  WBC 4.0 - 10.5 K/uL 6.1     RBC 4.22 - 5.81 MIL/uL 4.59     Hemoglobin 13.0 - 17.0 g/dL 19.1  47.8  29.5   HCT 39.0 - 52.0 % 41.6  34.3  33.2   Platelets 150 - 400 K/uL PLATELETS APPEAR ADEQUATE     NEUT# 1.7 - 7.7 K/uL 3.2     Lymph# 0.7 - 4.0 K/uL 2.4        There is no height or weight on file to calculate BMI.  Orders:  No orders of the defined types were placed in this encounter.  No orders of the defined types were placed in this encounter.    Procedures: No procedures performed  Clinical Data: No additional findings.  ROS:  All other systems negative, except as noted in the HPI. Review of Systems  Objective: Vital Signs: There were no vitals taken for this visit.  Specialty Comments:  No specialty comments available.  PMFS History: Patient Active Problem List  Diagnosis Date Noted   History of left below knee amputation (HCC) 04/20/2021   Hx of bladder cancer 04/19/2021   Tobacco use disorder 11/13/2018   Gastroesophageal reflux disease 11/13/2018   METHICILLIN RESISTANT STAPHYLOCOCCUS AUREUS INFECTION 07/03/2007   BKA, LEFT LEG 07/03/2007   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    Family History  Problem Relation Age of Onset   Diabetes Mother    Arthritis Mother    Cancer Mother    Hyperlipidemia Mother    Hypertension Mother    Arthritis Sister    Diabetes Sister    Arthritis Brother    Cancer Brother    Diabetes Maternal Grandmother     Diabetes Brother    Drug abuse Brother     Past Surgical History:  Procedure Laterality Date   ANKLE SURGERY  2005 & 2008   left ankle surgery--after fall of 40 feet   CYSTOSCOPY W/ URETERAL STENT PLACEMENT  02/02/2011   Procedure: CYSTOSCOPY WITH RETROGRADE PYELOGRAM/URETERAL STENT PLACEMENT;  Surgeon: Crecencio Mc, MD;  Location: [facility];  Service: Urology;  Laterality: Right;  Cystoscopy Right Retrograde Right Ureteral Stent    (c-arm)    I & D EXTREMITY Right 11/23/2014   Procedure: VY FLAP TO RIGHT MIDDLE FINGER;  Surgeon: Dairl Ponder, MD;  Location: [facility];  Service: Orthopedics;  Laterality: Right;   LEG AMPUTATION BELOW KNEE  2009   left below knee amputation -wears prosthesis   ROBOT ASSISTED PYELOPLASTY  02/02/2011   Procedure: ROBOTIC ASSISTED PYELOPLASTY WITH STENT PLACEMENT;  Surgeon: Crecencio Mc, MD;  Location: [facility];  Service: Urology;  Laterality: Right;  Cystoscopy Right Retrograde Right Ureteral Stent    (c-arm)    tur bt  11/21/10   Social History   Occupational History   Not on file  Tobacco Use   Smoking status: Every Day    Current packs/day: 0.50    Average packs/day: 0.5 packs/day for 42.7 years (21.3 ttl pk-yrs)    Types: Cigarettes    Start date: 09/03/1980   Smokeless tobacco: Never  Vaping Use   Vaping status: Not on file  Substance and Sexual Activity   Alcohol use: Yes   Drug use: No   Sexual activity: Not on file

## 2023-05-15 ENCOUNTER — Telehealth: Payer: Self-pay | Admitting: Podiatry

## 2023-05-15 NOTE — Telephone Encounter (Signed)
 Pt called and was seen back in 2019, and no showed in 2022, he was also a new pt in 2/25 and cxled appt, then no showed for 1 appt and we canceled an appt as well. He received a letter saying he was dismissed from out practice and is wanting to know if he could be seen. He stated he is an amputee and is in pain from the places on his foot. He states we are the only ones in network with his insurance. Are we able to see pt?

## 2023-05-28 ENCOUNTER — Ambulatory Visit (INDEPENDENT_AMBULATORY_CARE_PROVIDER_SITE_OTHER)

## 2023-05-28 ENCOUNTER — Encounter: Payer: Self-pay | Admitting: Podiatry

## 2023-05-28 ENCOUNTER — Ambulatory Visit (INDEPENDENT_AMBULATORY_CARE_PROVIDER_SITE_OTHER): Admitting: Podiatry

## 2023-05-28 DIAGNOSIS — D2371 Other benign neoplasm of skin of right lower limb, including hip: Secondary | ICD-10-CM

## 2023-05-28 DIAGNOSIS — M7751 Other enthesopathy of right foot: Secondary | ICD-10-CM

## 2023-05-28 DIAGNOSIS — D492 Neoplasm of unspecified behavior of bone, soft tissue, and skin: Secondary | ICD-10-CM

## 2023-05-28 NOTE — Progress Notes (Signed)
  Subjective:  Patient ID: Michael Morrison, male    DOB: [birth year],   MRN: [id number]  No chief complaint on file.   63 y.o. male presents for concern of right foot pain that has been a problem for him for years. Has been seen before for the issue and had it trimmed. He has a below knee amputation on the left leg . Denies any other pedal complaints. Denies n/v/f/c.   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    Objective:  Physical Exam: Vascular: DP/PT pulses 2/4 bilateral. CFT <3 seconds. Normal hair growth on digits. No edema.  Skin. No lacerations or abrasions bilateral feet. Hyperkeratotic cored lesion noted sub fifth metatarsal base on the right foot  Musculoskeletal: MMT 5/5 bilateral lower extremities in DF, PF, Inversion and Eversion. Deceased ROM in DF of ankle joint.  Neurological: Sensation intact to light touch.   Assessment:   1. Skin neoplasm      Plan:  Patient was evaluated and treated and all questions answered. -Discussed benign skin lesions with patient and treatment options.  -Hyperkeratotic tissue was debrided with chisel without incident.  -Applied salycylic acid treatment to area with dressing. Advised to remove bandaging tomorrow.  -Encouraged daily moisturizing -Discussed use of pumice stone -Advised good supportive shoes and inserts -Patient to return to office as needed or sooner if condition worsens.   Louann Sjogren, DPM

## 2023-05-30 ENCOUNTER — Ambulatory Visit

## 2023-05-30 ENCOUNTER — Telehealth: Payer: Self-pay

## 2023-05-30 NOTE — Telephone Encounter (Signed)
 Unsuccessful attempts to reach patient on preferred number listed in notes for scheduled AWV. Unable to leave message on voicemail.

## 2023-06-06 DIAGNOSIS — R002 Palpitations: Secondary | ICD-10-CM | POA: Diagnosis not present

## 2023-06-07 DIAGNOSIS — R002 Palpitations: Secondary | ICD-10-CM | POA: Diagnosis not present

## 2023-07-10 DIAGNOSIS — Z8551 Personal history of malignant neoplasm of bladder: Secondary | ICD-10-CM | POA: Diagnosis not present

## 2023-09-06 DIAGNOSIS — E663 Overweight: Secondary | ICD-10-CM | POA: Diagnosis not present

## 2023-09-06 DIAGNOSIS — Z008 Encounter for other general examination: Secondary | ICD-10-CM | POA: Diagnosis not present

## 2023-09-06 DIAGNOSIS — Z8551 Personal history of malignant neoplasm of bladder: Secondary | ICD-10-CM | POA: Diagnosis not present

## 2023-09-06 DIAGNOSIS — R7303 Prediabetes: Secondary | ICD-10-CM | POA: Diagnosis not present

## 2023-09-06 DIAGNOSIS — F17210 Nicotine dependence, cigarettes, uncomplicated: Secondary | ICD-10-CM | POA: Diagnosis not present

## 2023-09-06 DIAGNOSIS — Z89512 Acquired absence of left leg below knee: Secondary | ICD-10-CM | POA: Diagnosis not present

## 2023-09-06 DIAGNOSIS — Z6827 Body mass index (BMI) 27.0-27.9, adult: Secondary | ICD-10-CM | POA: Diagnosis not present

## 2023-10-11 DIAGNOSIS — M65312 Trigger thumb, left thumb: Secondary | ICD-10-CM | POA: Diagnosis not present

## 2023-10-11 DIAGNOSIS — M65311 Trigger thumb, right thumb: Secondary | ICD-10-CM | POA: Diagnosis not present

## 2023-10-11 DIAGNOSIS — M7712 Lateral epicondylitis, left elbow: Secondary | ICD-10-CM | POA: Diagnosis not present

## 2023-11-19 ENCOUNTER — Ambulatory Visit (INDEPENDENT_AMBULATORY_CARE_PROVIDER_SITE_OTHER): Admitting: Podiatry

## 2023-11-19 DIAGNOSIS — Z91199 Patient's noncompliance with other medical treatment and regimen due to unspecified reason: Secondary | ICD-10-CM

## 2023-11-19 NOTE — Progress Notes (Signed)
 No show

## 2023-11-22 ENCOUNTER — Ambulatory Visit (INDEPENDENT_AMBULATORY_CARE_PROVIDER_SITE_OTHER): Admitting: Family Medicine

## 2023-11-22 DIAGNOSIS — Z0000 Encounter for general adult medical examination without abnormal findings: Secondary | ICD-10-CM | POA: Diagnosis not present

## 2023-11-22 DIAGNOSIS — F17200 Nicotine dependence, unspecified, uncomplicated: Secondary | ICD-10-CM | POA: Diagnosis not present

## 2023-11-22 NOTE — Progress Notes (Signed)
 PATIENT CHECK-IN and HEALTH RISK ASSESSMENT QUESTIONNAIRE:  -completed by phone/video for upcoming Medicare Preventive Visit  Pre-Visit Check-in: 1)Vitals (height, wt, BP, etc) - record in vitals section for visit on day of visit Request home vitals (wt, BP, etc.) and enter into vitals, THEN update Vital Signs SmartPhrase below at the top of the HPI. See below.  2)Review and Update Medications, Allergies PMH, Surgeries, Social history in Epic 3)Hospitalizations in the last year with date/reason? n  4)Review and Update Care Team (patient's specialists) in Epic 5) Complete PHQ9 in Epic  6) Complete Fall Screening in Epic 7)Review all Health Maintenance Due and order if not done.  Medicare Wellness Patient Questionnaire:  Answer theses question about your habits: How often do you have a drink containing alcohol?rarely How many drinks containing alcohol do you have on a typical day when you are drinking?1-2 How often do you have six or more drinks on one occasion?never Have you ever smoked?y Quit date if applicable? na How many packs a day do/did you smoke? 3/4 ppd, for > 35 years; he does consider this a nasty habit and is interested in quitting as doesn't like the dependency, wife smokes which makes it more difficulty for him to quit. He wants to try cold [place] and prefers not to do the meds. 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 Smoothies with tumeric and ginger.   Answer theses question about your everyday activities: Can you perform most household chores?y Are you deaf or have significant trouble hearing?n Do you feel that you have a problem with memory?n Do you feel safe at  home?y Last dentist visit?goes on a regular basis 8. Do you have any difficulty performing your everyday activities?n Are you having any difficulty walking, taking medications on your own, and or difficulty managing daily home needs?n Do you have difficulty walking or climbing stairs?n Do you have difficulty dressing or bathing?n Do you have difficulty doing errands alone such as visiting a doctor's office or shopping?n Do you currently have any difficulty preparing food and eating?n Do you currently have any difficulty using the toilet?n Do you have any difficulty managing your finances?n Do you have any difficulties with housekeeping of managing your housekeeping?n   Do you have Advanced Directives in place (Living Will, Healthcare Power or Attorney)? y   Last eye Exam and location? Goes on the regular basis   Do you currently use prescribed or non-prescribed narcotic or opioid pain medications?n  Do you have a history or close family history of breast, ovarian, tubal or peritoneal cancer or a family member with BRCA (breast cancer susceptibility 1 and 2) gene mutations? See pmh/fh    ----------------------------------------------------------------------------------------------------------------------------------------------------------------------------------------------------------------------  Because this visit was a virtual/telehealth visit, some criteria may be missing or patient reported. Any vitals not documented were not able to be obtained and vitals that have been documented are patient reported.    MEDICARE ANNUAL PREVENTIVE VISIT WITH PROVIDER: (Welcome to Medicare, initial annual wellness or annual wellness exam)  Virtual Visit via Phone Note  I connected with Michael Morrison on 11/22/23 by phone  and verified that I am speaking with the correct person using two identifiers. Patient prefers to  talk by phone.   Location patient: home Location provider:work or home  office Persons participating in the virtual visit: patient, provider  Concerns and/or follow up today: reports all is stable currently.    See HM section in Epic for other details of completed HM.    ROS: negative for report of fevers, unintentional weight loss, vision changes, vision loss, hearing loss or change, chest pain, sob, hemoptysis, melena, hematochezia, hematuria, falls, bleeding or bruising, thoughts of suicide or self harm, memory loss  Patient-completed extensive health risk assessment - reviewed and discussed with the patient: See Health Risk Assessment completed with patient prior to the visit either above or in recent phone note. This was reviewed in detailed with the patient today and appropriate recommendations, orders and referrals were placed as needed per Summary below and patient instructions.   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    Past Surgical History:  Procedure Laterality Date   ANKLE SURGERY  2005 & 2008   left ankle surgery--after fall of 40 feet   CYSTOSCOPY W/ URETERAL STENT PLACEMENT  02/02/2011   Procedure: CYSTOSCOPY WITH RETROGRADE PYELOGRAM/URETERAL STENT PLACEMENT;  Surgeon: Noretta Ferrara, MD;  Location: [facility];  Service: Urology;  Laterality: Right;  Cystoscopy Right Retrograde Right Ureteral Stent    (c-arm)    I & D EXTREMITY Right 11/23/2014   Procedure: VY FLAP TO RIGHT MIDDLE FINGER;  Surgeon: Donnice Robinsons, MD;  Location: [facility];  Service: Orthopedics;  Laterality: Right;   LEG AMPUTATION  BELOW KNEE  2009   left below knee amputation -wears prosthesis   ROBOT ASSISTED PYELOPLASTY  02/02/2011   Procedure: ROBOTIC ASSISTED PYELOPLASTY WITH STENT PLACEMENT;  Surgeon: Noretta Ferrara, MD;  Location: [facility];  Service: Urology;  Laterality: Right;  Cystoscopy Right Retrograde Right Ureteral Stent    (c-arm)    tur bt  11/21/10    Social History   Socioeconomic History   Marital status: Divorced    Spouse name: Not on file   Number of children: Not on file   Years of education: Not on file   Highest education level: Not on file  Occupational History   Not on file  Tobacco Use   Smoking status: Every Day    Current packs/day: 0.50    Average packs/day: 0.5 packs/day for 43.2 years (21.6 ttl pk-yrs)    Types: Cigarettes    Start date: 09/03/1980   Smokeless tobacco: Never  Vaping Use   Vaping status: Not on file  Substance and Sexual Activity   Alcohol use: Yes   Drug use: No   Sexual activity: Not on file  Other Topics Concern   Not on file  Social History Narrative   Not on file   Social Drivers of Health   Financial Resource Strain: Low Risk  (07/12/2021)   Overall Financial Resource Strain (CARDIA)    Difficulty of Paying Living Expenses: Not hard at all  Food Insecurity: No Food Insecurity (07/12/2021)   Hunger Vital Sign    Worried About Running Out  of Food in the Last Year: Never true    Ran Out of Food in the Last Year: Never true  Transportation Needs: No Transportation Needs (07/12/2021)   PRAPARE - Administrator, Civil Service (Medical): No    Lack of Transportation (Non-Medical): No  Physical Activity: Insufficiently Active (07/12/2021)   Exercise Vital Sign    Days of Exercise per Week: 3 days    Minutes of Exercise per Session: 30 min  Stress: No Stress Concern Present (07/12/2021)   [employer] of Occupational Health - Occupational Stress Questionnaire    Feeling of Stress : Not at all  Social Connections: Moderately Isolated  (07/12/2021)   Social Connection and Isolation Panel    Frequency of Communication with Friends and Family: More than three times a week    Frequency of Social Gatherings with Friends and Family: More than three times a week    Attends Religious Services: Never    Database administrator or Organizations: No    Attends Banker Meetings: Never    Marital Status: Married  Catering manager Violence: Not At Risk (07/12/2021)   Humiliation, Afraid, Rape, and Kick questionnaire    Fear of Current or Ex-Partner: No    Emotionally Abused: No    Physically Abused: No    Sexually Abused: No    Family History  Problem Relation Age of Onset   Diabetes Mother    Arthritis Mother    Cancer Mother    Hyperlipidemia Mother    Hypertension Mother    Arthritis Sister    Diabetes Sister    Arthritis Brother    Cancer Brother    Diabetes Maternal Grandmother    Diabetes Brother    Drug abuse Brother     Current Outpatient Medications on File Prior to Visit  Medication Sig Dispense Refill   rosuvastatin  (CRESTOR ) 10 MG tablet Take 1 tablet (10 mg total) by mouth daily. (Patient not taking: Reported on 04/24/2023) 90 tablet 3   No current facility-administered medications on file prior to visit.    Allergies  Allergen Reactions   Other        Physical Exam Vitals requested from patient and listed below if patient had equipment and was able to obtain at home for this virtual visit: There were no vitals filed for this visit. Estimated body mass index is 27.72 kg/m as calculated from the following:   Height as of 04/24/23: 6' (1.829 m).   Weight as of 04/24/23: 204 lb 6.4 oz (92.7 kg).  EKG (optional): deferred due to virtual visit  GENERAL: alert, oriented, no acute distress detected, full vision exam deferred due to pandemic and/or virtual encounter  PSYCH/NEURO: pleasant and cooperative, no obvious depression or anxiety, speech and thought processing grossly intact,  Cognitive function grossly intact        11/22/2023    6:04 PM 04/19/2023   10:42 AM 07/12/2021    9:56 AM 04/19/2021    9:33 AM 11/13/2018    6:32 PM  Depression screen PHQ 2/9  Decreased Interest 0 0 0 0 0  Down, Depressed, Hopeless 0 0 0 0 0  PHQ - 2 Score 0 0 0 0 0       11/13/2018    6:32 PM 07/12/2021    9:58 AM 04/19/2023   10:42 AM 11/22/2023    6:04 PM  Fall Risk  Falls in the past year? 0  0 0 1  Was there an injury  with Fall? 0 0 0 0  Fall Risk Category Calculator 0 0 0   Fall Risk Category (Retired) Low  Low     (RETIRED) Patient Fall Risk Level Low fall risk  Low fall risk     Patient at Risk for Falls Due to  No Fall Risks No Fall Risks   Fall risk Follow up   Falls evaluation completed Falls evaluation completed;Education provided     Data saved with a previous flowsheet row definition   Clemens in a hole that the dog dug in the yard!  SUMMARY AND PLAN:  Smoker - Plan: Ambulatory Referral Lung Cancer Screening [facility]  Encounter for Medicare annual wellness exam  Discussed applicable health maintenance/preventive health measures and advised and referred or ordered per patient preferences: -discussed vaccines due and recs, he know can get flu shot at the office or the pharmacy -discussed lung cancer screening, referral placed -he is due for in office physical and agrees to schedule and will get labs then Health Maintenance  Topic Date Due   Zoster Vaccines- Shingrix  (1 of 2) Never done   Lung Cancer Screening  Never done   Pneumococcal Vaccine: 50+ Years (2 of 2 - PCV) 04/19/2022   Influenza Vaccine  09/28/2023   HIV Screening  11/21/2024 (Originally 09/03/1975)   Medicare Annual Wellness (AWV)  11/21/2024   DTaP/Tdap/Td (2 - Td or Tdap) 11/22/2024   Colonoscopy  08/24/2025   Hepatitis C Screening  Completed   Hepatitis B Vaccines 19-59 Average Risk  Aged Out   HPV VACCINES  Aged Out   Meningococcal B Vaccine  Aged Out   COVID-19 Vaccine   Discontinued      Education and counseling on the following was provided based on the above review of health and a plan/checklist for the patient, along with additional information discussed, was provided for the patient in the patient instructions :   -Provided counseling and plan for increased risk of falling if applicable per above screening.  -Advised and counseled on a healthy lifestyle - including the importance of a healthy diet, regular physical activity -Reviewed patient's current diet. Advised and counseled on a whole foods based healthy diet. A summary of a healthy diet was provided in the Patient Instructions.  -reviewed patient's current physical activity level and discussed exercise guidelines for adults. Discussed community resources and ideas for safe exercise at home to assist in meeting exercise guideline recommendations in a safe and healthy way.  -Advise yearly dental visits at minimum and regular eye exams -Advised and counseled on alcohol safe limits, risks/ tobacco use, risks of smoking and offered counseling/help - he is considering quitting, does not want to use medications, discussed strategies, barriers, motivation and provided quitline information  Follow up: see patient instructions     Patient Instructions  I really enjoyed getting to talk with you today! I am available on Tuesdays and Thursdays for virtual visits if you have any questions or concerns, or if I can be of any further assistance.   CHECKLIST FROM ANNUAL WELLNESS VISIT:  -Follow up (please call to schedule if not scheduled after visit):   -yearly for annual wellness visit with primary care office  Here is a list of your preventive care/health maintenance measures and the plan for each if any are due:  PLAN For any measures below that may be due:    1. [place] get vaccines at the pharmacy. Can get flu shot at the office. Please provide proof of receipt  for any vaccines done at the pharmacy so that  we can update your records.    2. Sen referral for the lung cancer screening. If not contacted by scheduling within the next 1-2 weeks please call: [phone]   3. Schedule your in office physical and come fasting so can get labs.  Health Maintenance  Topic Date Due   HIV Screening  Never done   Zoster Vaccines- Shingrix  (1 of 2) Never done   Lung Cancer Screening  Never done   Pneumococcal Vaccine: 50+ Years (2 of 2 - PCV) 04/19/2022   Medicare Annual Wellness (AWV)  07/14/2023   Influenza Vaccine  09/28/2023   DTaP/Tdap/Td (2 - Td or Tdap) 11/22/2024   Colonoscopy  08/24/2025   Hepatitis C Screening  Completed   Hepatitis B Vaccines 19-59 Average Risk  Aged Out   HPV VACCINES  Aged Out   Meningococcal B Vaccine  Aged Out   COVID-19 Vaccine  Discontinued    -See a dentist at least yearly  -Get your eyes checked and then per your eye specialist's recommendations  -Other issues addressed today:   Please quit smoking. Quitline: 1-800-QUITNOW   -I have included below further information regarding a healthy whole foods based diet, physical activity guidelines for adults, stress management and opportunities for social connections. I hope you find this information useful.   -----------------------------------------------------------------------------------------------------------------------------------------------------------------------------------------------------------------------------------------------------------    NUTRITION: -eat real food: lots of colorful vegetables (half the plate) and fruits -5-7 servings of vegetables and fruits per day (fresh or steamed is best), exp. 2 servings of vegetables with lunch and dinner and 2 servings of fruit per day. Berries and greens such as kale and collards are great choices.  -consume on a regular basis:  fresh fruits, fresh veggies, fish, nuts, seeds, healthy oils (such as olive oil, avocado oil), whole grains (make sure for  bread/pasta/crackers/etc., that the first ingredient on label contains the word whole), legumes. -can eat small amounts of dairy and lean meat (no larger than the palm of your hand), but avoid processed meats such as ham, bacon, lunch meat, etc. -drink water  -try to avoid fast food and pre-packaged foods, processed meat, ultra processed foods/beverages (donuts, candy, etc.) -most experts advise limiting sodium to < 2300mg  per day, should limit further is any chronic conditions such as high blood pressure, heart disease, diabetes, etc. The American Heart Association advised that < 1500mg  is is ideal -try to avoid foods/beverages that contain any ingredients with names you do not recognize  -try to avoid foods/beverages  with added sugar or sweeteners/sweets  -try to avoid sweet drinks (including diet drinks): soda, juice, Gatorade, sweet tea, power drinks, diet drinks -try to avoid white rice, white bread, pasta (unless whole grain)  EXERCISE GUIDELINES FOR ADULTS: -if you wish to increase your physical activity, do so gradually and with the approval of your doctor -STOP and seek medical care immediately if you have any chest pain, chest discomfort or trouble breathing when starting or increasing exercise  -move and stretch your body, legs, feet and arms when sitting for long periods -Physical activity guidelines for optimal health in adults: -get at least 150 minutes per week of moderate exercise (can talk, but not sing); this is about 20-30 minutes of sustained activity 5-7 days per week or two 10-15 minute episodes of sustained activity 5-7 days per week -do some muscle building/resistance training/strength training at least 2 days per week  -balance exercises 3+ days per week:   Stand somewhere where you  have something sturdy to hold onto if you lose balance    1) lift up on toes, then back down, start with 5x per day and work up to 20x   2) stand and lift one leg straight out to the side so  that foot is a few inches of the floor, start with 5x each side and work up to 20x each side   3) stand on one foot, start with 5 seconds each side and work up to 20 seconds on each side  If you need ideas or help with getting more active:  -Silver sneakers https://tools.silversneakers.com  -Walk with a Doc: http://www.duncan-williams.com/  -try to include resistance (weight lifting/strength building) and balance exercises twice per week: or the following link for ideas: http://castillo-powell.com/  BuyDucts.dk  STRESS MANAGEMENT: -can try meditating, or just sitting quietly with deep breathing while intentionally relaxing all parts of your body for 5 minutes daily -if you need further help with stress, anxiety or depression please follow up with your primary doctor or contact the wonderful folks at [facility]: [phone]  SOCIAL CONNECTIONS: -options in [place] if you wish to engage in more social and exercise related activities:  -Silver sneakers https://tools.silversneakers.com  -Walk with a Doc: http://www.duncan-williams.com/  -Check out the [facility] Active Adults 50+ section on the [place] of [employer] (hiking clubs, book clubs, cards and games, chess, exercise classes, aquatic classes and much more) - see the website for details: https://www.[place].gov/departments/parks-recreation/active-adults50  -YouTube has lots of exercise videos for different ages and abilities as well  -[facility] (a variety of indoor and outdoor inperson activities for adults). [phone]. [address]  -Virtual Online Classes (a variety of topics): see seniorplanet.org or call [phone]  -consider volunteering at a school, hospice center, church, senior center or elsewhere            Chiquita JONELLE Cramp, DO

## 2023-11-22 NOTE — Patient Instructions (Addendum)
 I really enjoyed getting to talk with you today! I am available on Tuesdays and Thursdays for virtual visits if you have any questions or concerns, or if I can be of any further assistance.   CHECKLIST FROM ANNUAL WELLNESS VISIT:  -Follow up (please call to schedule if not scheduled after visit):   -yearly for annual wellness visit with primary care office  Here is a list of your preventive care/health maintenance measures and the plan for each if any are due:  PLAN For any measures below that may be due:    1. [place] get vaccines at the pharmacy. Can get flu shot at the office. Please provide proof of receipt for any vaccines done at the pharmacy so that we can update your records.    2. Sen referral for the lung cancer screening. If not contacted by scheduling within the next 1-2 weeks please call: [phone]   3. Schedule your in office physical and come fasting so can get labs.  Health Maintenance  Topic Date Due   HIV Screening  Never done   Zoster Vaccines- Shingrix  (1 of 2) Never done   Lung Cancer Screening  Never done   Pneumococcal Vaccine: 50+ Years (2 of 2 - PCV) 04/19/2022   Medicare Annual Wellness (AWV)  07/14/2023   Influenza Vaccine  09/28/2023   DTaP/Tdap/Td (2 - Td or Tdap) 11/22/2024   Colonoscopy  08/24/2025   Hepatitis C Screening  Completed   Hepatitis B Vaccines 19-59 Average Risk  Aged Out   HPV VACCINES  Aged Out   Meningococcal B Vaccine  Aged Out   COVID-19 Vaccine  Discontinued    -See a dentist at least yearly  -Get your eyes checked and then per your eye specialist's recommendations  -Other issues addressed today:   Please quit smoking. Quitline: 1-800-QUITNOW   -I have included below further information regarding a healthy whole foods based diet, physical activity guidelines for adults, stress management and opportunities for social connections. I hope you find this information useful.    -----------------------------------------------------------------------------------------------------------------------------------------------------------------------------------------------------------------------------------------------------------    NUTRITION: -eat real food: lots of colorful vegetables (half the plate) and fruits -5-7 servings of vegetables and fruits per day (fresh or steamed is best), exp. 2 servings of vegetables with lunch and dinner and 2 servings of fruit per day. Berries and greens such as kale and collards are great choices.  -consume on a regular basis:  fresh fruits, fresh veggies, fish, nuts, seeds, healthy oils (such as olive oil, avocado oil), whole grains (make sure for bread/pasta/crackers/etc., that the first ingredient on label contains the word whole), legumes. -can eat small amounts of dairy and lean meat (no larger than the palm of your hand), but avoid processed meats such as ham, bacon, lunch meat, etc. -drink water  -try to avoid fast food and pre-packaged foods, processed meat, ultra processed foods/beverages (donuts, candy, etc.) -most experts advise limiting sodium to < 2300mg  per day, should limit further is any chronic conditions such as high blood pressure, heart disease, diabetes, etc. The American Heart Association advised that < 1500mg  is is ideal -try to avoid foods/beverages that contain any ingredients with names you do not recognize  -try to avoid foods/beverages  with added sugar or sweeteners/sweets  -try to avoid sweet drinks (including diet drinks): soda, juice, Gatorade, sweet tea, power drinks, diet drinks -try to avoid white rice, white bread, pasta (unless whole grain)  EXERCISE GUIDELINES FOR ADULTS: -if you wish to increase your physical activity, do so gradually  and with the approval of your doctor -STOP and seek medical care immediately if you have any chest pain, chest discomfort or trouble breathing when starting or  increasing exercise  -move and stretch your body, legs, feet and arms when sitting for long periods -Physical activity guidelines for optimal health in adults: -get at least 150 minutes per week of moderate exercise (can talk, but not sing); this is about 20-30 minutes of sustained activity 5-7 days per week or two 10-15 minute episodes of sustained activity 5-7 days per week -do some muscle building/resistance training/strength training at least 2 days per week  -balance exercises 3+ days per week:   Stand somewhere where you have something sturdy to hold onto if you lose balance    1) lift up on toes, then back down, start with 5x per day and work up to 20x   2) stand and lift one leg straight out to the side so that foot is a few inches of the floor, start with 5x each side and work up to 20x each side   3) stand on one foot, start with 5 seconds each side and work up to 20 seconds on each side  If you need ideas or help with getting more active:  -Silver sneakers https://tools.silversneakers.com  -Walk with a Doc: http://www.duncan-williams.com/  -try to include resistance (weight lifting/strength building) and balance exercises twice per week: or the following link for ideas: http://castillo-powell.com/  BuyDucts.dk  STRESS MANAGEMENT: -can try meditating, or just sitting quietly with deep breathing while intentionally relaxing all parts of your body for 5 minutes daily -if you need further help with stress, anxiety or depression please follow up with your primary doctor or contact the wonderful folks at [facility]: [phone]  SOCIAL CONNECTIONS: -options in [place] if you wish to engage in more social and exercise related activities:  -Silver sneakers https://tools.silversneakers.com  -Walk with a Doc: http://www.duncan-williams.com/  -Check out the [facility] Active Adults 50+  section on the [place] [employer] (hiking clubs, book clubs, cards and games, chess, exercise classes, aquatic classes and much more) - see the website for details: https://www.[place].gov/departments/parks-recreation/active-adults50  -YouTube has lots of exercise videos for different ages and abilities as well  -[facility] (a variety of indoor and outdoor inperson activities for adults). ([phone]. [address]  -Virtual Online Classes (a variety of topics): see seniorplanet.org or call [phone]  -consider volunteering at a school, hospice center, church, senior center or elsewhere

## 2023-11-30 ENCOUNTER — Telehealth: Payer: Self-pay | Admitting: Acute Care

## 2023-11-30 DIAGNOSIS — F17210 Nicotine dependence, cigarettes, uncomplicated: Secondary | ICD-10-CM

## 2023-11-30 DIAGNOSIS — Z122 Encounter for screening for malignant neoplasm of respiratory organs: Secondary | ICD-10-CM

## 2023-11-30 DIAGNOSIS — Z87891 Personal history of nicotine dependence: Secondary | ICD-10-CM

## 2023-11-30 NOTE — Telephone Encounter (Signed)
 Lung Cancer Screening Narrative/Criteria Questionnaire (Cigarette Smokers Only- No Cigars/Pipes/vapes)   Michael Morrison   SDMV:12/10/2023 12:30 [name]        06-Nov-1960   LDCT: 12/11/2023 10:30 GI    63 y.o.   Phone: [phone]  Lung Screening Narrative (confirm age 81-77 yrs Medicare / 50-80 yrs Private pay insurance)   Insurance information:[employer]   Referring Provider:Dr. [place] - PCP   This screening involves an initial phone call with a team member from our program. It is called a shared decision making visit. The initial meeting is required by  insurance and Medicare to make sure you understand the program. This appointment takes about 15-20 minutes to complete. You will complete the screening scan at your scheduled date/time.  This scan takes about 5-10 minutes to complete. You can eat and drink normally before and after the scan.  Criteria questions for Lung Cancer Screening:   Are you a current or former smoker? Current Age began smoking: 63yo   If you are a former smoker, what year did you quit smoking? N/A(within 15 yrs)   To calculate your smoking history, I need an accurate estimate of how many packs of cigarettes you smoked per day and for how many years. (Not just the number of PPD you are now smoking)   Years smoking 42 x Packs per day 1 = Pack years 42   (at least 20 pack yrs)   (Make sure they understand that we need to know how much they have smoked in the past, not just the number of PPD they are smoking now)  Do you have a personal history of cancer?  Yes - (type and when diagnosed - 5 yrs cancer free) Bladder - dx in 2012    Do you have a family history of cancer? No  Are you coughing up blood?  No  Have you had unexplained weight loss of 15 lbs or more in the last 6 months? No  It looks like you meet all criteria.  When would be a good time for us  to schedule you for this screening?   Additional information: N/A

## 2023-12-10 ENCOUNTER — Encounter: Payer: Self-pay | Admitting: *Deleted

## 2023-12-10 ENCOUNTER — Ambulatory Visit: Admitting: *Deleted

## 2023-12-10 DIAGNOSIS — F17210 Nicotine dependence, cigarettes, uncomplicated: Secondary | ICD-10-CM | POA: Diagnosis not present

## 2023-12-10 NOTE — Progress Notes (Signed)
 Virtual Visit via Telephone Note  I connected with Koren Jama Lesches on 12/10/23 at 12:30 PM EDT by telephone and verified that I am speaking with the correct person using two identifiers.  Location: Patient: in home Provider: [address] [place], [place], [address]    Shared Decision Making Visit Lung Cancer Screening Program [phone])   Eligibility: Age 63 y.o. Pack Years Smoking History Calculation 42 (# packs/per year x # years smoked) Recent History of coughing up blood  no Unexplained weight loss? no ( >Than 15 pounds within the last 6 months ) Prior History Lung / other cancer greater than 6 years ago- bladder cancer (Diagnosis within the last 5 years already requiring surveillance chest CT Scans). Smoking Status Current Smoker Former Smokers: Years since quit:  NA  Quit Date: NA  Visit Components: Discussion included one or more decision making aids. yes Discussion included risk/benefits of screening. yes Discussion included potential follow up diagnostic testing for abnormal scans. yes Discussion included meaning and risk of over diagnosis. yes Discussion included meaning and risk of False Positives. yes Discussion included meaning of total radiation exposure. yes  Counseling Included: Importance of adherence to annual lung cancer LDCT screening. yes Impact of comorbidities on ability to participate in the program. yes Ability and willingness to under diagnostic treatment. yes  Smoking Cessation Counseling: Current Smokers:  Discussed importance of smoking cessation. yes Information about tobacco cessation classes and interventions provided to patient. yes Patient provided with ticket for LDCT Scan. yes Symptomatic Patient. no  CounselingNA Diagnosis Code: Tobacco Use Z72.0 Asymptomatic Patient yes  Counseling (Intermediate counseling: > three minutes counseling) [id number]  Counseled patient 4 minutes regarding tobacco use.   Former Smokers:  Discussed  the importance of maintaining cigarette abstinence. yes Diagnosis Code: Personal History of Nicotine Dependence. S12.108 Information about tobacco cessation classes and interventions provided to patient. Yes Patient provided with ticket for LDCT Scan. yes Written Order for Lung Cancer Screening with LDCT placed in Epic. Yes (CT Chest Lung Cancer Screening Low Dose W/O CM) [id number] Z12.2-Screening of respiratory organs Z87.891-Personal history of nicotine dependence   Josette Ranger, RN 12/10/23

## 2023-12-10 NOTE — Patient Instructions (Signed)

## 2023-12-11 ENCOUNTER — Other Ambulatory Visit

## 2023-12-24 ENCOUNTER — Inpatient Hospital Stay: Admission: RE | Admit: 2023-12-24 | Source: Ambulatory Visit

## 2023-12-31 ENCOUNTER — Encounter: Payer: Self-pay | Admitting: Radiology
